# Patient Record
Sex: Male | Born: 1960 | Race: White | Hispanic: No | Marital: Single | Smoking: Never smoker
Health system: Southern US, Community
[De-identification: ages and names within clinical notes are randomized; demographics above are authoritative.]

## PROBLEM LIST (undated history)

## (undated) DIAGNOSIS — S66819A Strain of other specified muscles, fascia and tendons at wrist and hand level, unspecified hand, initial encounter: Secondary | ICD-10-CM

## (undated) DIAGNOSIS — I2510 Atherosclerotic heart disease of native coronary artery without angina pectoris: Secondary | ICD-10-CM

## (undated) DIAGNOSIS — F988 Other specified behavioral and emotional disorders with onset usually occurring in childhood and adolescence: Secondary | ICD-10-CM

## (undated) DIAGNOSIS — F419 Anxiety disorder, unspecified: Secondary | ICD-10-CM

## (undated) DIAGNOSIS — E7800 Pure hypercholesterolemia, unspecified: Secondary | ICD-10-CM

## (undated) DIAGNOSIS — I10 Essential (primary) hypertension: Secondary | ICD-10-CM

## (undated) HISTORY — DX: Atherosclerotic heart disease of native coronary artery without angina pectoris: I25.10

## (undated) HISTORY — DX: Pure hypercholesterolemia, unspecified: E78.00

## (undated) HISTORY — PX: WRIST SURGERY: SHX841

## (undated) HISTORY — DX: Essential (primary) hypertension: I10

## (undated) HISTORY — DX: Anxiety disorder, unspecified: F41.9

---

## 1998-01-31 ENCOUNTER — Emergency Department (HOSPITAL_COMMUNITY): Admission: EM | Admit: 1998-01-31 | Discharge: 1998-01-31 | Payer: Self-pay | Admitting: Emergency Medicine

## 1998-02-01 ENCOUNTER — Emergency Department (HOSPITAL_COMMUNITY): Admission: EM | Admit: 1998-02-01 | Discharge: 1998-02-01 | Payer: Self-pay | Admitting: Emergency Medicine

## 1998-02-02 ENCOUNTER — Emergency Department (HOSPITAL_COMMUNITY): Admission: EM | Admit: 1998-02-02 | Discharge: 1998-02-02 | Payer: Self-pay | Admitting: Emergency Medicine

## 2017-02-25 DIAGNOSIS — R945 Abnormal results of liver function studies: Secondary | ICD-10-CM | POA: Diagnosis not present

## 2017-03-29 DIAGNOSIS — R945 Abnormal results of liver function studies: Secondary | ICD-10-CM | POA: Diagnosis not present

## 2017-05-03 DIAGNOSIS — F4010 Social phobia, unspecified: Secondary | ICD-10-CM | POA: Diagnosis not present

## 2017-05-17 DIAGNOSIS — F4010 Social phobia, unspecified: Secondary | ICD-10-CM | POA: Diagnosis not present

## 2017-05-19 ENCOUNTER — Emergency Department (HOSPITAL_COMMUNITY): Payer: BLUE CROSS/BLUE SHIELD

## 2017-05-19 ENCOUNTER — Emergency Department (HOSPITAL_COMMUNITY)
Admission: EM | Admit: 2017-05-19 | Discharge: 2017-05-19 | Disposition: A | Discharge status: Home / Self Care | Payer: BLUE CROSS/BLUE SHIELD | Attending: Emergency Medicine | Admitting: Emergency Medicine

## 2017-05-19 ENCOUNTER — Encounter (HOSPITAL_COMMUNITY): Payer: Self-pay

## 2017-05-19 ENCOUNTER — Other Ambulatory Visit: Payer: Self-pay

## 2017-05-19 DIAGNOSIS — Z23 Encounter for immunization: Secondary | ICD-10-CM | POA: Insufficient documentation

## 2017-05-19 DIAGNOSIS — S61216A Laceration without foreign body of right little finger without damage to nail, initial encounter: Secondary | ICD-10-CM | POA: Insufficient documentation

## 2017-05-19 DIAGNOSIS — Y9389 Activity, other specified: Secondary | ICD-10-CM | POA: Insufficient documentation

## 2017-05-19 DIAGNOSIS — Y998 Other external cause status: Secondary | ICD-10-CM | POA: Insufficient documentation

## 2017-05-19 DIAGNOSIS — W25XXXA Contact with sharp glass, initial encounter: Secondary | ICD-10-CM | POA: Diagnosis not present

## 2017-05-19 DIAGNOSIS — Y929 Unspecified place or not applicable: Secondary | ICD-10-CM | POA: Diagnosis not present

## 2017-05-19 HISTORY — DX: Other specified behavioral and emotional disorders with onset usually occurring in childhood and adolescence: F98.8

## 2017-05-19 MED ORDER — BUPIVACAINE HCL (PF) 0.5 % IJ SOLN
30.0000 mL | Freq: Once | INTRAMUSCULAR | Status: AC
  Administered 2017-05-19: 30 mL
  Filled 2017-05-19: qty 30

## 2017-05-19 MED ORDER — HYDROCODONE-ACETAMINOPHEN 5-325 MG PO TABS
1.0000 | ORAL_TABLET | Freq: Once | ORAL | Status: AC
  Administered 2017-05-19: 1 via ORAL
  Filled 2017-05-19: qty 1

## 2017-05-19 MED ORDER — HYDROCODONE-ACETAMINOPHEN 5-325 MG PO TABS: 1.0000 | ORAL_TABLET | Freq: Four times a day (QID) | ORAL | 0 refills | Status: DC | PRN

## 2017-05-19 MED ORDER — BUPIVACAINE HCL 0.5 % IJ SOLN
50.0000 mL | Freq: Once | INTRAMUSCULAR | Status: DC
  Filled 2017-05-19: qty 50

## 2017-05-19 MED ORDER — AMOXICILLIN-POT CLAVULANATE 875-125 MG PO TABS
1.0000 | ORAL_TABLET | Freq: Once | ORAL | Status: AC
  Administered 2017-05-19: 1 via ORAL
  Filled 2017-05-19: qty 1

## 2017-05-19 MED ORDER — AMOXICILLIN-POT CLAVULANATE 875-125 MG PO TABS: 1.0000 | ORAL_TABLET | Freq: Two times a day (BID) | ORAL | 0 refills | Status: AC

## 2017-05-19 MED ORDER — TETANUS-DIPHTH-ACELL PERTUSSIS 5-2.5-18.5 LF-MCG/0.5 IM SUSP
0.5000 mL | Freq: Once | INTRAMUSCULAR | Status: AC
  Administered 2017-05-19: 0.5 mL via INTRAMUSCULAR
  Filled 2017-05-19: qty 0.5

## 2017-05-19 MED ORDER — BACITRACIN ZINC 500 UNIT/GM EX OINT
TOPICAL_OINTMENT | Freq: Once | CUTANEOUS | Status: AC
  Administered 2017-05-19: 1 via TOPICAL

## 2017-05-19 NOTE — ED Notes (Signed)
MD at bedside. 

## 2017-05-19 NOTE — ED Provider Notes (Signed)
[facility] Provider Note   CSN: [id number] Arrival date & time: 05/19/17  1854     History   Chief Complaint Chief Complaint  Patient presents with  . Extremity Laceration    HPI Edward Chapman is a 56 y.o. male resents with right fifth digit laceration that occurred approximately 6:30 PM this evening.  Patient reports that he was putting trash away and cut his left pinky on a piece of broken glass that was still in the trash can.  Patient initially went to urgent care and was prompted to go to the emergency department for further evaluation.  Patient reports initially had some numbness to the distal aspect of the finger but states that has improved.  Patient reports that he does not know when his last tetanus was.  He is not on blood thinners.    The history is provided by the patient.    Past Medical History:  Diagnosis Date  . ADD (attention deficit disorder)     There are no active problems to display for this patient.   History reviewed. No pertinent surgical history.     Home Medications    Prior to Admission medications   Medication Sig Start Date End Date Taking? Authorizing Provider  amoxicillin-clavulanate (AUGMENTIN) 875-125 MG tablet Take 1 tablet every 12 (twelve) hours for 7 days by mouth. 05/19/17 05/26/17  Maxwell CaulLayden, Facundo Allemand A, PA-C  HYDROcodone-acetaminophen (NORCO/VICODIN) 5-325 MG tablet Take 1-2 tablets every 6 (six) hours as needed by mouth. 05/19/17   Maxwell CaulLayden, Laylynn Campanella A, PA-C    Family History History reviewed. No pertinent family history.  Social History Social History   Tobacco Use  . Smoking status: Never Smoker  Substance Use Topics  . Alcohol use: Yes    Frequency: Never    Comment: occ  . Drug use: No     Allergies   Patient has no known allergies.   Review of Systems Review of Systems  Skin: Positive for wound.  Neurological: Negative for weakness and numbness.     Physical  Exam Updated Vital Signs BP 134/85   Pulse 72   Temp 98.5 F (36.9 C) (Oral)   Resp 16   Ht 6\' 2"  (1.88 m)   Wt 104.3 kg (230 lb)   SpO2 99%   BMI 29.53 kg/m   Physical Exam  Constitutional: He appears well-developed and well-nourished.  HENT:  Head: Normocephalic and atraumatic.  Eyes: Conjunctivae and EOM are normal. Right eye exhibits no discharge. Left eye exhibits no discharge. No scleral icterus.  Cardiovascular:  Pulses:      Radial pulses are 2+ on the right side, and 2+ on the left side.  Pulmonary/Chest: Effort normal.  Musculoskeletal:  Patient is unable to flex the right fifth digit.  No Flexion of the DIP.  Extension of right fifth digit intact without difficulty.  Full range of motion of the remaining digits of right hand.  No tenderness palpation to right wrist.  Neurological: He is alert.  Sensation intact along major nerve distributions of BLE. Good sensation to the distal tip of the right 5th digit.   Skin: Skin is warm and dry. Capillary refill takes less than 2 seconds.  1.5 cm V-shaped laceration noted to the dorsal aspect of the left 5th digit. Good distal cap refill of right 5th digit.   Psychiatric: He has a normal mood and affect. His speech is normal and behavior is normal.  Nursing note and vitals reviewed.  ED Treatments / Results  Labs (all labs ordered are listed, but only abnormal results are displayed) Labs Reviewed - No data to display  EKG  EKG Interpretation None       Radiology Dg Finger Little Right  Result Date: 05/19/2017 CLINICAL DATA:  Acute right little finger pain following laceration today. EXAM: RIGHT LITTLE FINGER 2+V COMPARISON:  None. FINDINGS: Soft tissue injury anterior to the PIP joint noted. No fracture, subluxation or dislocation identified. No radiopaque foreign bodies identified. IMPRESSION: Soft tissue injury without acute bony abnormality or radiopaque foreign body. Electronically Signed   By: Harmon Pier M.D.   On: 05/19/2017 19:59    Procedures .Marland KitchenLaceration Repair Date/Time: 05/19/2017 9:22 PM Performed by: Maxwell Caul, PA-C Authorized by: Maxwell Caul, PA-C   Consent:    Consent obtained:  Verbal   Consent given by:  Patient   Risks discussed:  Infection, pain and retained foreign body Anesthesia (see MAR for exact dosages):    Anesthesia method:  Nerve block   Block needle gauge:  25 G   Block anesthetic:  Bupivacaine 0.5% w/o epi   Block injection procedure:  Anatomic landmarks identified, incremental injection, introduced needle and negative aspiration for blood   Block outcome:  Anesthesia achieved Laceration details:    Location:  Finger   Finger location:  R small finger   Length (cm):  1.5 Pre-procedure details:    Preparation:  Patient was prepped and draped in usual sterile fashion Exploration:    Hemostasis achieved with:  Direct pressure   Wound exploration: wound explored through full range of motion     Wound extent: no foreign bodies/material noted   Treatment:    Area cleansed with:  Betadine and saline   Amount of cleaning:  Extensive   Irrigation solution:  Sterile saline   Irrigation method:  Syringe   Visualized foreign bodies/material removed: no   Skin repair:    Repair method:  Sutures   Suture size:  4-0   Suture material:  Nylon   Suture technique:  Simple interrupted   Number of sutures:  5 Approximation:    Approximation:  Close   Vermilion border: well-aligned   Post-procedure details:    Dressing:  Antibiotic ointment, sterile dressing and splint for protection   Patient tolerance of procedure:  Tolerated well, no immediate complications    (including critical care time)  Medications Ordered in ED Medications  Tdap (BOOSTRIX) injection 0.5 mL (0.5 mLs Intramuscular Given 05/19/17 2008)  HYDROcodone-acetaminophen (NORCO/VICODIN) 5-325 MG per tablet 1 tablet (1 tablet Oral Given 05/19/17 2007)  bupivacaine (MARCAINE) 0.5  % injection 30 mL (30 mLs Infiltration Given by Other 05/19/17 2147)  HYDROcodone-acetaminophen (NORCO/VICODIN) 5-325 MG per tablet 1 tablet (1 tablet Oral Given 05/19/17 2144)  bacitracin ointment (1 application Topical Given 05/19/17 2147)  amoxicillin-clavulanate (AUGMENTIN) 875-125 MG per tablet 1 tablet (1 tablet Oral Given 05/19/17 2144)     Initial Impression / Assessment and Plan / ED Course  I have reviewed the triage vital signs and the nursing notes.  Pertinent labs & imaging results that were available during my care of the patient were reviewed by me and considered in my medical decision making (see chart for details).     56 year old male who presents with right fifth digit laceration that occurred approximately 6:30 PM this evening.  Patient does not know when his last tetanus was.  He is not on blood thinners. Patient is afebrile, non-toxic appearing, sitting comfortably  on examination table. Vital signs reviewed and stable. Patient is neurovascularly intact.  Patient is unable to flex the right fifth digit.  Extension is intact.  Concern for flexor tendon injury.  Plan for wound care in the department.  Will update tetanus in the department. Plan to obtain XR imaging for further evaluation. Plan to consult hand for further evaluation.  X-ray reviewed.  Negative for any acute fracture or dislocation.  No evidence of foreign body.  Discussed with Dr. Merlyn LotKuzma (hand).  Recommends repair in the emergency department.  Recommend thorough washout for evaluation of any foreign body.  Plan to start patient on antibiotics and have him follow-up with his office.  Instructed patient to call office tomorrow.  Laceration repaired as documented above.  Patient tolerated procedure well.  We will plan to give first dose made biotics and pain medication here in the emergency department.  Wound care instructions discussed with patient and wife.  Patient instructed to follow-up with hand tomorrow.  Patient had ample opportunity for questions and discussion. All patient's questions were answered with full understanding. Strict return precautions discussed. Patient expresses understanding and agreement to plan.     Final Clinical Impressions(s) / ED Diagnoses   Final diagnoses:  Laceration of right little finger without foreign body without damage to nail, initial encounter    ED Discharge Orders        Ordered    amoxicillin-clavulanate (AUGMENTIN) 875-125 MG tablet  Every 12 hours     05/19/17 2125    HYDROcodone-acetaminophen (NORCO/VICODIN) 5-325 MG tablet  Every 6 hours PRN     05/19/17 2126       Maxwell CaulLayden, Westlynn Fifer A, PA-C 05/20/17 0127    Loren RacerYelverton, David, MD 05/24/17 [phone]

## 2017-05-19 NOTE — ED Notes (Signed)
Pharmacy contacted about Marcaine

## 2017-05-19 NOTE — Discharge Instructions (Signed)
Keep the wound clean and dry for the first 24 hours. After that you may gently clean the wound with soap and water. Make sure to pat dry the wound before covering it with any dressing. You can use topical antibiotic ointment and bandage. Ice and elevate for pain relief.   You can take Tylenol or Ibuprofen as directed for pain. You can alternate Tylenol and Ibuprofen every 4 hours for additional pain relief.  You take the pain medication for severe or breakthrough pain.  Follow-up with a hand doctor.  Call his office tomorrow and arrange for an appointment.  Monitor closely for any signs of infection. Return to the Emergency Department for any worsening redness/swelling of the area that begins to spread, drainage from the site, worsening pain, fever or any other worsening or concerning symptoms.

## 2017-05-19 NOTE — ED Notes (Signed)
Patient is A&Ox4 at this time.  Patient in no signs of distress.  Please see providers note for complete history and physical exam.  

## 2017-05-19 NOTE — ED Triage Notes (Signed)
Pt was stuffing a trash bag down into his garbage can and was cut on his left pinky finger with broken glass. Pt has 1 inch laceration to same finger. Bleeding controlled. [facility]

## 2017-05-20 ENCOUNTER — Ambulatory Visit (HOSPITAL_BASED_OUTPATIENT_CLINIC_OR_DEPARTMENT_OTHER)
Admission: RE | Admit: 2017-05-20 | Discharge: 2017-05-20 | Disposition: A | Discharge status: Home / Self Care | Payer: BLUE CROSS/BLUE SHIELD | Source: Ambulatory Visit | Attending: Orthopedic Surgery | Admitting: Orthopedic Surgery

## 2017-05-20 ENCOUNTER — Ambulatory Visit (HOSPITAL_BASED_OUTPATIENT_CLINIC_OR_DEPARTMENT_OTHER): Payer: BLUE CROSS/BLUE SHIELD | Admitting: Anesthesiology

## 2017-05-20 ENCOUNTER — Other Ambulatory Visit: Payer: Self-pay | Admitting: Orthopedic Surgery

## 2017-05-20 ENCOUNTER — Encounter (HOSPITAL_BASED_OUTPATIENT_CLINIC_OR_DEPARTMENT_OTHER)
Admission: RE | Disposition: A | Discharge status: Home / Self Care | Payer: Self-pay | Source: Ambulatory Visit | Attending: Orthopedic Surgery

## 2017-05-20 ENCOUNTER — Encounter (HOSPITAL_BASED_OUTPATIENT_CLINIC_OR_DEPARTMENT_OTHER): Payer: Self-pay | Admitting: Anesthesiology

## 2017-05-20 DIAGNOSIS — Z79899 Other long term (current) drug therapy: Secondary | ICD-10-CM | POA: Insufficient documentation

## 2017-05-20 DIAGNOSIS — S64496A Injury of digital nerve of right little finger, initial encounter: Secondary | ICD-10-CM | POA: Diagnosis not present

## 2017-05-20 DIAGNOSIS — S61216A Laceration without foreign body of right little finger without damage to nail, initial encounter: Secondary | ICD-10-CM | POA: Diagnosis not present

## 2017-05-20 DIAGNOSIS — W25XXXA Contact with sharp glass, initial encounter: Secondary | ICD-10-CM | POA: Diagnosis not present

## 2017-05-20 DIAGNOSIS — S65516A Laceration of blood vessel of right little finger, initial encounter: Secondary | ICD-10-CM | POA: Diagnosis not present

## 2017-05-20 DIAGNOSIS — S65111A Laceration of radial artery at wrist and hand level of right arm, initial encounter: Secondary | ICD-10-CM | POA: Diagnosis not present

## 2017-05-20 DIAGNOSIS — S66126A Laceration of flexor muscle, fascia and tendon of right little finger at wrist and hand level, initial encounter: Secondary | ICD-10-CM | POA: Diagnosis not present

## 2017-05-20 HISTORY — PX: NERVE, TENDON AND ARTERY REPAIR: SHX5695

## 2017-05-20 SURGERY — NERVE, TENDON AND ARTERY REPAIR
Anesthesia: General | Site: Finger | Laterality: Right

## 2017-05-20 MED ORDER — METOPROLOL TARTRATE 5 MG/5ML IV SOLN
INTRAVENOUS | Status: AC
  Filled 2017-05-20: qty 5

## 2017-05-20 MED ORDER — FENTANYL CITRATE (PF) 100 MCG/2ML IJ SOLN
INTRAMUSCULAR | Status: DC | PRN
  Administered 2017-05-20: 100 ug via INTRAVENOUS
  Administered 2017-05-20 (×4): 25 ug via INTRAVENOUS

## 2017-05-20 MED ORDER — PROPOFOL 10 MG/ML IV BOLUS
INTRAVENOUS | Status: AC
  Filled 2017-05-20: qty 20

## 2017-05-20 MED ORDER — FENTANYL CITRATE (PF) 100 MCG/2ML IJ SOLN: 50.0000 ug | INTRAMUSCULAR | Status: DC | PRN

## 2017-05-20 MED ORDER — LACTATED RINGERS IV SOLN: 500.0000 mL | INTRAVENOUS | Status: DC

## 2017-05-20 MED ORDER — BUPIVACAINE HCL (PF) 0.25 % IJ SOLN
INTRAMUSCULAR | Status: DC | PRN
  Administered 2017-05-20: 8 mL

## 2017-05-20 MED ORDER — PROPOFOL 10 MG/ML IV BOLUS
INTRAVENOUS | Status: DC | PRN
  Administered 2017-05-20: 200 mg via INTRAVENOUS

## 2017-05-20 MED ORDER — ONDANSETRON HCL 4 MG/2ML IJ SOLN
INTRAMUSCULAR | Status: AC
  Filled 2017-05-20: qty 2

## 2017-05-20 MED ORDER — PROMETHAZINE HCL 25 MG/ML IJ SOLN: 6.2500 mg | INTRAMUSCULAR | Status: DC | PRN

## 2017-05-20 MED ORDER — LACTATED RINGERS IV SOLN
INTRAVENOUS | Status: DC
  Administered 2017-05-20 (×3): via INTRAVENOUS

## 2017-05-20 MED ORDER — HEPARIN SODIUM (PORCINE) 1000 UNIT/ML IJ SOLN
INTRAMUSCULAR | Status: AC
  Filled 2017-05-20: qty 1

## 2017-05-20 MED ORDER — MIDAZOLAM HCL 5 MG/5ML IJ SOLN
INTRAMUSCULAR | Status: DC | PRN
  Administered 2017-05-20: 2 mg via INTRAVENOUS

## 2017-05-20 MED ORDER — PHENYLEPHRINE 40 MCG/ML (10ML) SYRINGE FOR IV PUSH (FOR BLOOD PRESSURE SUPPORT)
PREFILLED_SYRINGE | INTRAVENOUS | Status: AC
  Filled 2017-05-20: qty 10

## 2017-05-20 MED ORDER — ROCURONIUM BROMIDE 10 MG/ML (PF) SYRINGE
PREFILLED_SYRINGE | INTRAVENOUS | Status: AC
  Filled 2017-05-20: qty 5

## 2017-05-20 MED ORDER — FENTANYL CITRATE (PF) 100 MCG/2ML IJ SOLN
INTRAMUSCULAR | Status: AC
  Filled 2017-05-20: qty 2

## 2017-05-20 MED ORDER — SCOPOLAMINE 1 MG/3DAYS TD PT72: 1.0000 | MEDICATED_PATCH | Freq: Once | TRANSDERMAL | Status: DC | PRN

## 2017-05-20 MED ORDER — 0.9 % SODIUM CHLORIDE (POUR BTL) OPTIME
TOPICAL | Status: DC | PRN
  Administered 2017-05-20: 100 mL

## 2017-05-20 MED ORDER — MIDAZOLAM HCL 2 MG/2ML IJ SOLN
INTRAMUSCULAR | Status: AC
  Filled 2017-05-20: qty 2

## 2017-05-20 MED ORDER — BUPIVACAINE HCL (PF) 0.25 % IJ SOLN
INTRAMUSCULAR | Status: AC
  Filled 2017-05-20: qty 30

## 2017-05-20 MED ORDER — MIDAZOLAM HCL 2 MG/2ML IJ SOLN: 1.0000 mg | INTRAMUSCULAR | Status: DC | PRN

## 2017-05-20 MED ORDER — CEFAZOLIN SODIUM-DEXTROSE 2-3 GM-%(50ML) IV SOLR
INTRAVENOUS | Status: DC | PRN
  Administered 2017-05-20: 2 g via INTRAVENOUS

## 2017-05-20 MED ORDER — FENTANYL CITRATE (PF) 100 MCG/2ML IJ SOLN
25.0000 ug | INTRAMUSCULAR | Status: DC | PRN
  Administered 2017-05-20 (×2): 50 ug via INTRAVENOUS

## 2017-05-20 MED ORDER — LIDOCAINE HCL (PF) 1 % IJ SOLN
INTRAMUSCULAR | Status: AC
  Filled 2017-05-20: qty 30

## 2017-05-20 MED ORDER — LIDOCAINE HCL (CARDIAC) 20 MG/ML IV SOLN
INTRAVENOUS | Status: DC | PRN
  Administered 2017-05-20 (×2): 30 mg via INTRAVENOUS

## 2017-05-20 MED ORDER — METOPROLOL TARTRATE 5 MG/5ML IV SOLN
INTRAVENOUS | Status: DC | PRN
  Administered 2017-05-20: 2.5 mg via INTRAVENOUS

## 2017-05-20 MED ORDER — EPHEDRINE 5 MG/ML INJ
INTRAVENOUS | Status: AC
  Filled 2017-05-20: qty 20

## 2017-05-20 MED ORDER — MEPERIDINE HCL 25 MG/ML IJ SOLN: 6.2500 mg | INTRAMUSCULAR | Status: DC | PRN

## 2017-05-20 MED ORDER — HYDROCODONE-ACETAMINOPHEN 7.5-325 MG PO TABS: 1.0000 | ORAL_TABLET | Freq: Once | ORAL | Status: DC | PRN

## 2017-05-20 MED ORDER — HYDROCODONE-ACETAMINOPHEN 5-325 MG PO TABS: ORAL_TABLET | ORAL | 0 refills | Status: DC

## 2017-05-20 MED ORDER — SODIUM CHLORIDE 0.9 % IJ SOLN
INTRAMUSCULAR | Status: AC
  Filled 2017-05-20: qty 10

## 2017-05-20 MED ORDER — CEFAZOLIN SODIUM 1 G IJ SOLR
INTRAMUSCULAR | Status: AC
  Filled 2017-05-20: qty 20

## 2017-05-20 MED ORDER — DEXAMETHASONE SODIUM PHOSPHATE 10 MG/ML IJ SOLN
INTRAMUSCULAR | Status: AC
  Filled 2017-05-20: qty 1

## 2017-05-20 MED ORDER — DEXAMETHASONE SODIUM PHOSPHATE 4 MG/ML IJ SOLN
INTRAMUSCULAR | Status: DC | PRN
  Administered 2017-05-20: 10 mg via INTRAVENOUS

## 2017-05-20 SURGICAL SUPPLY — 68 items
BAG DECANTER FOR FLEXI CONT (MISCELLANEOUS) IMPLANT
BANDAGE ACE 3X5.8 VEL STRL LF (GAUZE/BANDAGES/DRESSINGS) ×3 IMPLANT
BLADE MINI RND TIP GREEN BEAV (BLADE) IMPLANT
BLADE SURG 15 STRL LF DISP TIS (BLADE) ×2 IMPLANT
BLADE SURG 15 STRL SS (BLADE) ×6
BNDG CMPR 9X4 STRL LF SNTH (GAUZE/BANDAGES/DRESSINGS) ×1
BNDG ESMARK 4X9 LF (GAUZE/BANDAGES/DRESSINGS) ×2 IMPLANT
BNDG GAUZE ELAST 4 BULKY (GAUZE/BANDAGES/DRESSINGS) ×3 IMPLANT
BRUSH SCRUB EZ PLAIN DRY (MISCELLANEOUS) ×5 IMPLANT
CATH ROBINSON RED A/P 10FR (CATHETERS) IMPLANT
CHLORAPREP W/TINT 26ML (MISCELLANEOUS) ×1 IMPLANT
CORD BIPOLAR FORCEPS 12FT (ELECTRODE) ×3 IMPLANT
COVER BACK TABLE 60X90IN (DRAPES) ×3 IMPLANT
COVER MAYO STAND STRL (DRAPES) ×3 IMPLANT
CUFF TOURNIQUET SINGLE 18IN (TOURNIQUET CUFF) IMPLANT
CUFF TOURNIQUET SINGLE 24IN (TOURNIQUET CUFF) ×2 IMPLANT
DECANTER SPIKE VIAL GLASS SM (MISCELLANEOUS) ×3 IMPLANT
DRAPE EXTREMITY T 121X128X90 (DRAPE) ×3 IMPLANT
DRAPE SURG 17X23 STRL (DRAPES) ×3 IMPLANT
GAUZE SPONGE 4X4 12PLY STRL (GAUZE/BANDAGES/DRESSINGS) ×3 IMPLANT
GAUZE XEROFORM 1X8 LF (GAUZE/BANDAGES/DRESSINGS) ×3 IMPLANT
GLOVE BIO SURGEON STRL SZ 6.5 (GLOVE) ×2 IMPLANT
GLOVE BIO SURGEON STRL SZ7 (GLOVE) ×2 IMPLANT
GLOVE BIO SURGEON STRL SZ7.5 (GLOVE) ×3 IMPLANT
GLOVE BIO SURGEONS STRL SZ 6.5 (GLOVE) ×1
GLOVE BIOGEL PI IND STRL 8 (GLOVE) ×1 IMPLANT
GLOVE BIOGEL PI IND STRL 8.5 (GLOVE) ×3 IMPLANT
GLOVE BIOGEL PI INDICATOR 8 (GLOVE) ×2
GLOVE BIOGEL PI INDICATOR 8.5 (GLOVE) ×6
GLOVE SURG ORTHO 8.0 STRL STRW (GLOVE) ×2 IMPLANT
GOWN STRL REUS W/ TWL LRG LVL3 (GOWN DISPOSABLE) ×1 IMPLANT
GOWN STRL REUS W/ TWL XL LVL3 (GOWN DISPOSABLE) IMPLANT
GOWN STRL REUS W/TWL LRG LVL3 (GOWN DISPOSABLE) ×3
GOWN STRL REUS W/TWL XL LVL3 (GOWN DISPOSABLE) ×9 IMPLANT
LOOP VESSEL MAXI BLUE (MISCELLANEOUS) IMPLANT
NDL SAFETY ECLIPSE 18X1.5 (NEEDLE) IMPLANT
NEEDLE HYPO 18GX1.5 SHARP (NEEDLE)
NEEDLE HYPO 25X1 1.5 SAFETY (NEEDLE) ×3 IMPLANT
NS IRRIG 1000ML POUR BTL (IV SOLUTION) ×3 IMPLANT
PACK BASIN DAY SURGERY FS (CUSTOM PROCEDURE TRAY) ×3 IMPLANT
PAD CAST 3X4 CTTN HI CHSV (CAST SUPPLIES) ×1 IMPLANT
PAD CAST 4YDX4 CTTN HI CHSV (CAST SUPPLIES) IMPLANT
PADDING CAST ABS 4INX4YD NS (CAST SUPPLIES) ×2
PADDING CAST ABS COTTON 4X4 ST (CAST SUPPLIES) ×1 IMPLANT
PADDING CAST COTTON 3X4 STRL (CAST SUPPLIES)
PADDING CAST COTTON 4X4 STRL (CAST SUPPLIES) ×3
SLEEVE SCD COMPRESS KNEE MED (MISCELLANEOUS) ×2 IMPLANT
SLEEVE SURGEON STRL (DRAPES) ×2 IMPLANT
SPEAR EYE SURG WECK-CEL (MISCELLANEOUS) ×3 IMPLANT
SPLINT PLASTER CAST XFAST 3X15 (CAST SUPPLIES) IMPLANT
SPLINT PLASTER XTRA FASTSET 3X (CAST SUPPLIES) ×34
STOCKINETTE 4X48 STRL (DRAPES) ×3 IMPLANT
SUT CHROMIC 5 0 P 3 (SUTURE) ×2 IMPLANT
SUT ETHIBOND 3-0 V-5 (SUTURE) IMPLANT
SUT ETHILON 4 0 PS 2 18 (SUTURE) ×3 IMPLANT
SUT FIBERWIRE 4-0 18 TAPR NDL (SUTURE)
SUT MERSILENE 4 0 P 3 (SUTURE) ×2 IMPLANT
SUT NYLON 9 0 VRM6 (SUTURE) ×3 IMPLANT
SUT PROLENE 6 0 P 1 18 (SUTURE) ×2 IMPLANT
SUT SILK 4 0 PS 2 (SUTURE) ×2 IMPLANT
SUT SUPRAMID 4-0 (SUTURE) ×2 IMPLANT
SUT VICRYL 4-0 PS2 18IN ABS (SUTURE) IMPLANT
SUTURE FIBERWR 4-0 18 TAPR NDL (SUTURE) IMPLANT
SYR BULB 3OZ (MISCELLANEOUS) ×3 IMPLANT
SYR CONTROL 10ML LL (SYRINGE) ×2 IMPLANT
TOWEL OR 17X24 6PK STRL BLUE (TOWEL DISPOSABLE) ×6 IMPLANT
TRAY DSU PREP LF (CUSTOM PROCEDURE TRAY) ×2 IMPLANT
UNDERPAD 30X30 (UNDERPADS AND DIAPERS) ×3 IMPLANT

## 2017-05-20 NOTE — Brief Op Note (Signed)
05/20/2017  3:18 PM  PATIENT:  Queen BlossomJeffrey Holcomb  56 y.o. male  PRE-OPERATIVE DIAGNOSIS:  LACERATION  RIGHT SMALL FINGER  POST-OPERATIVE DIAGNOSIS:  LACERATION  RIGHT SMALL FINGER  PROCEDURE:  Procedure(s): NERVE, TENDON AND ARTERY REPAIR RIGHT SMALL FINGER (Right)  SURGEON:  Surgeon(s) and Role:    * Betha LoaKuzma, Zyere Jiminez, MD - Primary    * Cindee SaltKuzma, Gary, MD - Assisting  PHYSICIAN ASSISTANT:   ASSISTANTS: Cindee SaltGary Eluzer Howdeshell, MD   ANESTHESIA:   general  EBL:  6 mL   BLOOD ADMINISTERED:none  DRAINS: none   LOCAL MEDICATIONS USED:  MARCAINE     SPECIMEN:  No Specimen  DISPOSITION OF SPECIMEN:  N/A  COUNTS:  YES  TOURNIQUET:   Total Tourniquet Time Documented: Upper Arm (Right) - 83 minutes Total: Upper Arm (Right) - 83 minutes   DICTATION: .Other Dictation: Dictation Number [id number]  PLAN OF CARE: Discharge to home after PACU  PATIENT DISPOSITION:  PACU - hemodynamically stable.

## 2017-05-20 NOTE — H&P (Signed)
  Edward Chapman is an 56 y.o. male.   Chief Complaint: right small finger laceration HPI: 56 yo lhd male states he sustained laceration to right small finger last night on piece of glass in garbage.  [facility]  Unable to flex small finger.  Allergies: Not on File  Past Medical History:  Diagnosis Date  . ADD (attention deficit disorder)     History reviewed. No pertinent surgical history.  Family History: History reviewed. No pertinent family history.  Social History:   reports that  has never smoked. he has never used smokeless tobacco. He reports that he drinks alcohol. He reports that he does not use drugs.  Medications: Medications Prior to Admission  Medication Sig Dispense Refill  . amoxicillin-clavulanate (AUGMENTIN) 875-125 MG tablet Take 1 tablet every 12 (twelve) hours for 7 days by mouth. 14 tablet 0  . guanFACINE (INTUNIV) 2 MG TB24 ER tablet Take 2 mg daily by mouth.    Marland Kitchen. HYDROcodone-acetaminophen (NORCO/VICODIN) 5-325 MG tablet Take 1-2 tablets every 6 (six) hours as needed by mouth. 6 tablet 0  . Lisdexamfetamine Dimesylate (VYVANSE PO) Take 1 day or 1 dose by mouth.      No results found for this or any previous visit (from the past 48 hour(s)).  Dg Finger Little Right  Result Date: 05/19/2017 CLINICAL DATA:  Acute right little finger pain following laceration today. EXAM: RIGHT LITTLE FINGER 2+V COMPARISON:  None. FINDINGS: Soft tissue injury anterior to the PIP joint noted. No fracture, subluxation or dislocation identified. No radiopaque foreign bodies identified. IMPRESSION: Soft tissue injury without acute bony abnormality or radiopaque foreign body. Electronically Signed   By: Harmon PierJeffrey  Hu M.D.   On: 05/19/2017 19:59     A comprehensive review of systems was negative.  Blood pressure 134/89, pulse 80, temperature 98.1 F (36.7 C), temperature source Oral, height 6\' 2"  (1.88 m), weight 108.1 kg (238 lb 4 oz), SpO2 98  %.  General appearance: alert, cooperative and appears stated age Head: Normocephalic, without obvious abnormality, atraumatic Neck: supple, symmetrical, trachea midline Resp: clear to auscultation bilaterally Cardio: regular rate and rhythm GI: non-tender Extremities: Intact sensation and capillary refill all digits.  +epl/fpl/io.  Laceration to volar aspect right small at p1.  Unable to flex small finger.  Decreased sensation on radial side. Pulses: 2+ and symmetric Skin: Skin color, texture, turgor normal. No rashes or lesions Neurologic: Grossly normal Incision/Wound:as above  Assessment/Plan Right small finger laceration with tendon and possible nerve/artery lacerations.  Non operative and operative treatment options were discussed with the patient and patient wishes to proceed with operative treatment. Risks, benefits, and alternatives of surgery were discussed and the patient agrees with the plan of care.   [name] 05/20/2017, 1:09 PM

## 2017-05-20 NOTE — Op Note (Signed)
[id number] 

## 2017-05-20 NOTE — Op Note (Signed)
NAMJob Founds:  Griebel, Verdie              ACCOUNT NO.:  [id number]  MEDICAL RECORD NO.:  [id number]  LOCATION:                                 FACILITY:  PHYSICIAN:  Betha LoaKevin Tajha Sammarco, MD        DATE OF BIRTH:  [birth year]  DATE OF PROCEDURE:  05/20/2017 DATE OF DISCHARGE:                              OPERATIVE REPORT   PREOPERATIVE DIAGNOSIS:  Right small finger laceration with possible tendon, artery, and nerve laceration.  POSTOPERATIVE DIAGNOSIS:  Right small finger flexor digitorum profundus and flexor digitorum superficialis zone 2 lacerations and radial digital nerve and artery laceration.  PROCEDURE:   1. Right small finger repair of FDS zone 2 laceration 2. Right small finger repair of FDP zone 2 laceration 3. Right small finger repair of radial digital nerve under microscope 4. Right small finger repair of radial digital artery under microscope.  SURGEON:  Betha LoaKevin Margarit Minshall, MD.  ASSISTANT:  Cindee SaltGary Shandrika Ambers, MD.  ANESTHESIA:  General.  IV FLUIDS:  Per anesthesia flow sheet.  ESTIMATED BLOOD LOSS:  Minimal.  COMPLICATIONS:  None.  SPECIMENS:  None.  TOURNIQUET TIME:  83 minutes.  DISPOSITION:  Stable to PACU.  INDICATIONS:  Mr. Edward Chapman is a [age] year old male, who states that yesterday he was pushing garbage down into the garbage can and sustained a laceration to the right small finger.  He has been unable to flex the small finger.  He was seen in the emergency department where the wound was cleaned and sutured.  He wished to proceed with operative exploration and repair.  Risks, benefits, and alternatives of surgery were discussed including the risk of blood loss; infection; damage to nerves, vessels, tendons, ligaments, bone; failure of surgery; need for additional surgery; complications with wound healing; continued pain and stiffness.  He voiced understanding of these risks and elected to proceed.  OPERATIVE COURSE:  After being identified preoperatively by myself,  the patient and I agreed upon procedure and site of procedure.  Surgical site was marked.  Risks, benefits, and alternatives of surgery were reviewed and he wished to proceed.  Surgical consent had been signed. He was given IV Ancef as preoperative antibiotic prophylaxis.  He was transferred to the operating room and placed on the operating room table in supine position with the right upper extremity on arm board.  General anesthesia was induced by anesthesiologist.  Right upper extremity was prepped and draped in normal sterile orthopedic fashion.  A surgical pause was performed between surgeons, Anesthesia, and operating room staff; and all were in agreement as to the patient, procedure, and site of procedure.  Tourniquet at the proximal aspect of the extremity was inflated to 250 mmHg after exsanguination of the limb with an Esmarch bandage.  The wound was extended in a Brunner type fashion proximally. Complete laceration of the FDP and FDS tendons were found.  The laceration went down to the proximal phalanx itself.  There was complete laceration of the radial digital nerve and artery on the radial side. The ulnar digital nerve was identified and was intact.  The ulnar digital artery was deep to the zone of injury.  The FDP and FDS were retrieved from proximally.  The ulnar arm of the FDS was excised to allow better excursion of the tendons and the sheath.  The periosteum and soft tissues were repaired back over the exposed bone of the proximal phalanx with a 4-0 chromic suture.  The remaining radial arm of the FDS tendon was repaired with a 4-0 Mersilene in a figure-of-eight fashion.  The FDP tendon was repaired with a 4-0 looped Supramid suture in a modified Kessler technique and a running 6-0 Prolene epitendinous suture was placed as well.  This provided good reapposition of the tendon ends.  The microscope was brought in.  The radial digital artery was cleared of clot and the ends  freshened.  The artery was then repaired in an interrupted circumferential fashion using 9-0 nylon suture.  The radial digital nerve was then repaired with the 9-0 nylon suture again in an interrupted circumferential fashion.  Good apposition of soft tissues was achieved.  The wound was copiously irrigated with sterile saline.  It was then closed with 4-0 nylon in a horizontal mattress fashion.  Digital block was performed with 8 mL of 0.25% plain Marcaine to aid in postoperative analgesia.  The wound was dressed with sterile Xeroform, 4x4s, and wrapped with a Kerlix bandage.  Dorsal blocking splint with the wrist 30 to 40 degrees flexed, the MPs flexed, and IPs extended.  This was wrapped with Kerlix and Ace bandage. Tourniquet was deflated at 83 minutes.  Fingertips were pink with brisk capillary refill after deflation of tourniquet.  The operative drapes were broken down, and the patient was awakened from anesthesia safely. He was transferred back to the stretcher and taken to PACU in stable condition.  I will see him back in the office in 1 week for postoperative followup.  I will give him Norco 5/325 one to two p.o. q.6 hours p.r.n. pain, dispensed #30.     Betha LoaKevin Desyre Calma, MD     KK/MEDQ  D:  05/20/2017  T:  05/20/2017  Job:  [id number]

## 2017-05-20 NOTE — Discharge Instructions (Addendum)

## 2017-05-20 NOTE — Op Note (Signed)
I assisted Surgeon(s) and Role:    * Betha LoaKuzma, Kevin, MD - Primary    Cindee Salt* Lynnex Fulp, MD - Assisting on the Procedure(s): NERVE, TENDON AND ARTERY REPAIR RIGHT SMALL FINGER on 05/20/2017.  I provided assistance on this case as follows: exploration, identification of arterey, nerve and tendons, repair of the tendons, artery and nerve using the microscope, closure of the wounds and application of the dressings and splint.   Electronically signed by: Nicki ReaperKUZMA,Anitra Doxtater R, MD Date: 05/20/2017 Time: 3:18 PM

## 2017-05-20 NOTE — Anesthesia Procedure Notes (Signed)
Procedure Name: LMA Insertion Date/Time: 05/20/2017 1:32 PM Performed by: [place] DesanctisLinka, Pavlos Yon L, CRNA Pre-anesthesia Checklist: Patient identified, Emergency Drugs available, Suction available, Patient being monitored and Timeout performed Patient Re-evaluated:Patient Re-evaluated prior to induction Oxygen Delivery Method: Circle system utilized Preoxygenation: Pre-oxygenation with 100% oxygen Induction Type: IV induction Ventilation: Mask ventilation without difficulty LMA: LMA inserted LMA Size: 5.0 Number of attempts: 1 Airway Equipment and Method: Bite block Placement Confirmation: positive ETCO2 Tube secured with: Tape Dental Injury: Teeth and Oropharynx as per pre-operative assessment

## 2017-05-20 NOTE — Transfer of Care (Signed)
Immediate Anesthesia Transfer of Care Note  Patient: Edward BlossomJeffrey Chapman  Procedure(s) Performed: NERVE, TENDON AND ARTERY REPAIR RIGHT SMALL FINGER (Right Finger)  Patient Location: PACU  Anesthesia Type:General  Level of Consciousness: sedated  Airway & Oxygen Therapy: Patient Spontanous Breathing and Patient connected to face mask oxygen  Post-op Assessment: Report given to RN and Post -op Vital signs reviewed and stable  Post vital signs: Reviewed and stable  Last Vitals:  Vitals:   05/20/17 1231  BP: 134/89  Pulse: 80  Temp: 36.7 C  SpO2: 98%    Last Pain:  Vitals:   05/20/17 1231  TempSrc: Oral  PainSc: 5          Complications: No apparent anesthesia complications

## 2017-05-20 NOTE — Anesthesia Postprocedure Evaluation (Signed)
Anesthesia Post Note  Patient: Edward Chapman  Procedure(s) Performed: NERVE, TENDON AND ARTERY REPAIR RIGHT SMALL FINGER (Right Finger)     Patient location during evaluation: PACU Anesthesia Type: General Level of consciousness: awake and alert and oriented Pain management: pain level controlled Vital Signs Assessment: post-procedure vital signs reviewed and stable Respiratory status: spontaneous breathing, nonlabored ventilation and respiratory function stable Cardiovascular status: blood pressure returned to baseline and stable Postop Assessment: no apparent nausea or vomiting Anesthetic complications: no    Last Vitals:  Vitals:   05/20/17 1613 05/20/17 1632  BP:  (!) 148/93  Pulse: 79 80  Resp: 17 18  Temp:  36.8 C  SpO2: 94% 97%    Last Pain:  Vitals:   05/20/17 1632  TempSrc: Oral  PainSc: 2                  Arnice Vanepps A.

## 2017-05-20 NOTE — Anesthesia Preprocedure Evaluation (Signed)
Anesthesia Evaluation  Patient identified by MRN, date of birth, ID band Patient awake    Reviewed: Allergy & Precautions, NPO status , Patient's Chart, lab work & pertinent test results  Airway Mallampati: II  TM Distance: >3 FB Neck ROM: Full    Dental no notable dental hx. (+) Teeth Intact   Pulmonary neg pulmonary ROS,    Pulmonary exam normal breath sounds clear to auscultation       Cardiovascular negative cardio ROS Normal cardiovascular exam Rhythm:Regular Rate:Normal     Neuro/Psych PSYCHIATRIC DISORDERS ADDnegative neurological ROS     GI/Hepatic negative GI ROS, Neg liver ROS,   Endo/Other  negative endocrine ROS  Renal/GU negative Renal ROS  negative genitourinary   Musculoskeletal Laceration right small finger   Abdominal   Peds  Hematology negative hematology ROS (+)   Anesthesia Other Findings   Reproductive/Obstetrics                             Anesthesia Physical Anesthesia Plan  ASA: II  Anesthesia Plan: General   Post-op Pain Management:    Induction: Intravenous  PONV Risk Score and Plan:   Airway Management Planned: LMA  Additional Equipment:   Intra-op Plan:   Post-operative Plan:   Informed Consent: I have reviewed the patients History and Physical, chart, labs and discussed the procedure including the risks, benefits and alternatives for the proposed anesthesia with the patient or authorized representative who has indicated his/her understanding and acceptance.   Dental advisory given  Plan Discussed with: CRNA, Anesthesiologist and Surgeon  Anesthesia Plan Comments:         Anesthesia Quick Evaluation

## 2017-05-23 ENCOUNTER — Encounter (HOSPITAL_BASED_OUTPATIENT_CLINIC_OR_DEPARTMENT_OTHER): Payer: Self-pay | Admitting: Orthopedic Surgery

## 2017-05-25 DIAGNOSIS — S66126D Laceration of flexor muscle, fascia and tendon of right little finger at wrist and hand level, subsequent encounter: Secondary | ICD-10-CM | POA: Diagnosis not present

## 2017-05-25 DIAGNOSIS — S64496D Injury of digital nerve of right little finger, subsequent encounter: Secondary | ICD-10-CM | POA: Diagnosis not present

## 2017-05-25 DIAGNOSIS — S65516D Laceration of blood vessel of right little finger, subsequent encounter: Secondary | ICD-10-CM | POA: Diagnosis not present

## 2017-05-25 DIAGNOSIS — M25641 Stiffness of right hand, not elsewhere classified: Secondary | ICD-10-CM | POA: Diagnosis not present

## 2017-05-25 DIAGNOSIS — M79644 Pain in right finger(s): Secondary | ICD-10-CM | POA: Diagnosis not present

## 2017-05-31 DIAGNOSIS — F4010 Social phobia, unspecified: Secondary | ICD-10-CM | POA: Diagnosis not present

## 2017-06-01 ENCOUNTER — Other Ambulatory Visit: Payer: Self-pay | Admitting: Orthopedic Surgery

## 2017-06-01 DIAGNOSIS — S66126D Laceration of flexor muscle, fascia and tendon of right little finger at wrist and hand level, subsequent encounter: Secondary | ICD-10-CM

## 2017-06-03 ENCOUNTER — Ambulatory Visit
Admission: RE | Admit: 2017-06-03 | Discharge: 2017-06-03 | Disposition: A | Discharge status: Home / Self Care | Payer: BLUE CROSS/BLUE SHIELD | Source: Ambulatory Visit | Attending: Orthopedic Surgery | Admitting: Orthopedic Surgery

## 2017-06-03 DIAGNOSIS — S66126D Laceration of flexor muscle, fascia and tendon of right little finger at wrist and hand level, subsequent encounter: Secondary | ICD-10-CM

## 2017-06-03 DIAGNOSIS — S56221A Laceration of other flexor muscle, fascia and tendon at forearm level, right arm, initial encounter: Secondary | ICD-10-CM | POA: Diagnosis not present

## 2017-06-06 ENCOUNTER — Encounter (HOSPITAL_BASED_OUTPATIENT_CLINIC_OR_DEPARTMENT_OTHER): Payer: Self-pay | Admitting: *Deleted

## 2017-06-06 ENCOUNTER — Other Ambulatory Visit: Payer: Self-pay | Admitting: Orthopedic Surgery

## 2017-06-06 ENCOUNTER — Other Ambulatory Visit: Payer: Self-pay

## 2017-06-09 ENCOUNTER — Ambulatory Visit (HOSPITAL_BASED_OUTPATIENT_CLINIC_OR_DEPARTMENT_OTHER): Payer: BLUE CROSS/BLUE SHIELD | Admitting: Anesthesiology

## 2017-06-09 ENCOUNTER — Encounter (HOSPITAL_BASED_OUTPATIENT_CLINIC_OR_DEPARTMENT_OTHER): Payer: Self-pay | Admitting: Anesthesiology

## 2017-06-09 ENCOUNTER — Encounter (HOSPITAL_BASED_OUTPATIENT_CLINIC_OR_DEPARTMENT_OTHER)
Admission: RE | Disposition: A | Discharge status: Home / Self Care | Payer: Self-pay | Source: Ambulatory Visit | Attending: Orthopedic Surgery

## 2017-06-09 ENCOUNTER — Ambulatory Visit (HOSPITAL_BASED_OUTPATIENT_CLINIC_OR_DEPARTMENT_OTHER)
Admission: RE | Admit: 2017-06-09 | Discharge: 2017-06-09 | Disposition: A | Discharge status: Home / Self Care | Payer: BLUE CROSS/BLUE SHIELD | Source: Ambulatory Visit | Attending: Orthopedic Surgery | Admitting: Orthopedic Surgery

## 2017-06-09 ENCOUNTER — Other Ambulatory Visit: Payer: Self-pay

## 2017-06-09 DIAGNOSIS — S66116A Strain of flexor muscle, fascia and tendon of right little finger at wrist and hand level, initial encounter: Secondary | ICD-10-CM | POA: Insufficient documentation

## 2017-06-09 DIAGNOSIS — X58XXXA Exposure to other specified factors, initial encounter: Secondary | ICD-10-CM | POA: Diagnosis not present

## 2017-06-09 DIAGNOSIS — G8918 Other acute postprocedural pain: Secondary | ICD-10-CM | POA: Diagnosis not present

## 2017-06-09 DIAGNOSIS — Z79899 Other long term (current) drug therapy: Secondary | ICD-10-CM | POA: Diagnosis not present

## 2017-06-09 DIAGNOSIS — T85622A Displacement of permanent sutures, initial encounter: Secondary | ICD-10-CM | POA: Diagnosis not present

## 2017-06-09 DIAGNOSIS — M66341 Spontaneous rupture of flexor tendons, right hand: Secondary | ICD-10-CM | POA: Diagnosis not present

## 2017-06-09 HISTORY — PX: FLEXOR TENDON REPAIR: SHX6501

## 2017-06-09 SURGERY — REPAIR, TENDON, FLEXOR
Anesthesia: General | Site: Hand | Laterality: Right

## 2017-06-09 MED ORDER — FENTANYL CITRATE (PF) 100 MCG/2ML IJ SOLN
50.0000 ug | INTRAMUSCULAR | Status: AC | PRN
  Administered 2017-06-09 (×2): 25 ug via INTRAVENOUS
  Administered 2017-06-09 (×2): 50 ug via INTRAVENOUS

## 2017-06-09 MED ORDER — VASOPRESSIN 20 UNIT/ML IV SOLN
INTRAVENOUS | Status: AC
  Filled 2017-06-09: qty 1

## 2017-06-09 MED ORDER — PROPOFOL 10 MG/ML IV BOLUS
INTRAVENOUS | Status: AC
  Filled 2017-06-09: qty 20

## 2017-06-09 MED ORDER — LIDOCAINE 2% (20 MG/ML) 5 ML SYRINGE
INTRAMUSCULAR | Status: AC
  Filled 2017-06-09: qty 5

## 2017-06-09 MED ORDER — CEFAZOLIN SODIUM-DEXTROSE 2-4 GM/100ML-% IV SOLN
INTRAVENOUS | Status: AC
  Filled 2017-06-09: qty 100

## 2017-06-09 MED ORDER — SCOPOLAMINE 1 MG/3DAYS TD PT72: 1.0000 | MEDICATED_PATCH | Freq: Once | TRANSDERMAL | Status: DC | PRN

## 2017-06-09 MED ORDER — LACTATED RINGERS IV SOLN
INTRAVENOUS | Status: DC
  Administered 2017-06-09 (×2): via INTRAVENOUS

## 2017-06-09 MED ORDER — LIDOCAINE 2% (20 MG/ML) 5 ML SYRINGE
INTRAMUSCULAR | Status: DC | PRN
  Administered 2017-06-09: 80 mg via INTRAVENOUS

## 2017-06-09 MED ORDER — BUPIVACAINE-EPINEPHRINE (PF) 0.5% -1:200000 IJ SOLN
INTRAMUSCULAR | Status: DC | PRN
  Administered 2017-06-09: 30 mL via PERINEURAL

## 2017-06-09 MED ORDER — ONDANSETRON HCL 4 MG/2ML IJ SOLN
INTRAMUSCULAR | Status: DC | PRN
  Administered 2017-06-09: 4 mg via INTRAVENOUS

## 2017-06-09 MED ORDER — OXYCODONE HCL 5 MG PO TABS
5.0000 mg | ORAL_TABLET | Freq: Once | ORAL | Status: DC | PRN
Start: 2017-06-09 — End: 2017-06-09

## 2017-06-09 MED ORDER — CHLORHEXIDINE GLUCONATE 4 % EX LIQD: 60.0000 mL | Freq: Once | CUTANEOUS | Status: DC

## 2017-06-09 MED ORDER — ONDANSETRON HCL 4 MG/2ML IJ SOLN: 4.0000 mg | Freq: Four times a day (QID) | INTRAMUSCULAR | Status: DC | PRN

## 2017-06-09 MED ORDER — MIDAZOLAM HCL 2 MG/2ML IJ SOLN
INTRAMUSCULAR | Status: AC
  Filled 2017-06-09: qty 2

## 2017-06-09 MED ORDER — OXYCODONE HCL 5 MG/5ML PO SOLN: 5.0000 mg | Freq: Once | ORAL | Status: DC | PRN

## 2017-06-09 MED ORDER — DEXAMETHASONE SODIUM PHOSPHATE 10 MG/ML IJ SOLN
INTRAMUSCULAR | Status: AC
  Filled 2017-06-09: qty 1

## 2017-06-09 MED ORDER — DEXAMETHASONE SODIUM PHOSPHATE 4 MG/ML IJ SOLN
INTRAMUSCULAR | Status: DC | PRN
  Administered 2017-06-09: 10 mg via INTRAVENOUS

## 2017-06-09 MED ORDER — CEFAZOLIN SODIUM-DEXTROSE 2-4 GM/100ML-% IV SOLN
2.0000 g | INTRAVENOUS | Status: AC
  Administered 2017-06-09: 2 g via INTRAVENOUS

## 2017-06-09 MED ORDER — FENTANYL CITRATE (PF) 100 MCG/2ML IJ SOLN
INTRAMUSCULAR | Status: AC
  Filled 2017-06-09: qty 2

## 2017-06-09 MED ORDER — ONDANSETRON HCL 4 MG/2ML IJ SOLN
INTRAMUSCULAR | Status: AC
  Filled 2017-06-09: qty 2

## 2017-06-09 MED ORDER — OXYCODONE-ACETAMINOPHEN 5-325 MG PO TABS: ORAL_TABLET | ORAL | 0 refills | Status: DC

## 2017-06-09 MED ORDER — MIDAZOLAM HCL 2 MG/2ML IJ SOLN
1.0000 mg | INTRAMUSCULAR | Status: DC | PRN
  Administered 2017-06-09: 1 mg via INTRAVENOUS
  Administered 2017-06-09: 2 mg via INTRAVENOUS

## 2017-06-09 MED ORDER — FENTANYL CITRATE (PF) 100 MCG/2ML IJ SOLN: 25.0000 ug | INTRAMUSCULAR | Status: DC | PRN

## 2017-06-09 SURGICAL SUPPLY — 79 items
BAG DECANTER FOR FLEXI CONT (MISCELLANEOUS) IMPLANT
BALL CTTN LRG ABS STRL LF (GAUZE/BANDAGES/DRESSINGS)
BANDAGE ACE 3X5.8 VEL STRL LF (GAUZE/BANDAGES/DRESSINGS) ×3 IMPLANT
BLADE MINI RND TIP GREEN BEAV (BLADE) IMPLANT
BLADE SURG 15 STRL LF DISP TIS (BLADE) ×2 IMPLANT
BLADE SURG 15 STRL SS (BLADE) ×6
BNDG CMPR 9X4 STRL LF SNTH (GAUZE/BANDAGES/DRESSINGS) ×1
BNDG CONFORM 2 STRL LF (GAUZE/BANDAGES/DRESSINGS) IMPLANT
BNDG ELASTIC 2X5.8 VLCR STR LF (GAUZE/BANDAGES/DRESSINGS) IMPLANT
BNDG ESMARK 4X9 LF (GAUZE/BANDAGES/DRESSINGS) ×3 IMPLANT
BNDG GAUZE ELAST 4 BULKY (GAUZE/BANDAGES/DRESSINGS) ×3 IMPLANT
CATH ROBINSON RED A/P 10FR (CATHETERS) IMPLANT
CHLORAPREP W/TINT 26ML (MISCELLANEOUS) ×3 IMPLANT
CORD BIPOLAR FORCEPS 12FT (ELECTRODE) ×3 IMPLANT
COTTONBALL LRG STERILE PKG (GAUZE/BANDAGES/DRESSINGS) IMPLANT
COVER BACK TABLE 60X90IN (DRAPES) ×3 IMPLANT
COVER MAYO STAND STRL (DRAPES) ×3 IMPLANT
CUFF TOURNIQUET SINGLE 18IN (TOURNIQUET CUFF) ×3 IMPLANT
DECANTER SPIKE VIAL GLASS SM (MISCELLANEOUS) IMPLANT
DRAIN TLS ROUND 10FR (DRAIN) IMPLANT
DRAPE EXTREMITY T 121X128X90 (DRAPE) ×3 IMPLANT
DRAPE OEC MINIVIEW 54X84 (DRAPES) IMPLANT
DRAPE SURG 17X23 STRL (DRAPES) ×3 IMPLANT
DRSG PAD ABDOMINAL 8X10 ST (GAUZE/BANDAGES/DRESSINGS) IMPLANT
GAUZE SPONGE 4X4 12PLY STRL (GAUZE/BANDAGES/DRESSINGS) ×3 IMPLANT
GAUZE SPONGE 4X4 16PLY XRAY LF (GAUZE/BANDAGES/DRESSINGS) IMPLANT
GAUZE XEROFORM 1X8 LF (GAUZE/BANDAGES/DRESSINGS) ×3 IMPLANT
GLOVE BIO SURGEON STRL SZ7.5 (GLOVE) ×3 IMPLANT
GLOVE BIOGEL PI IND STRL 8 (GLOVE) ×1 IMPLANT
GLOVE BIOGEL PI IND STRL 8.5 (GLOVE) IMPLANT
GLOVE BIOGEL PI INDICATOR 8 (GLOVE) ×2
GLOVE BIOGEL PI INDICATOR 8.5 (GLOVE)
GLOVE SURG ORTHO 8.0 STRL STRW (GLOVE) IMPLANT
GOWN STRL REUS W/ TWL LRG LVL3 (GOWN DISPOSABLE) ×1 IMPLANT
GOWN STRL REUS W/TWL LRG LVL3 (GOWN DISPOSABLE) ×3
GOWN STRL REUS W/TWL XL LVL3 (GOWN DISPOSABLE) ×3 IMPLANT
K-WIRE .035X4 (WIRE) IMPLANT
LOOP VESSEL MAXI BLUE (MISCELLANEOUS) IMPLANT
NDL KEITH (NEEDLE) IMPLANT
NEEDLE HYPO 25X1 1.5 SAFETY (NEEDLE) IMPLANT
NEEDLE KEITH (NEEDLE) IMPLANT
NS IRRIG 1000ML POUR BTL (IV SOLUTION) ×3 IMPLANT
PACK BASIN DAY SURGERY FS (CUSTOM PROCEDURE TRAY) ×3 IMPLANT
PAD CAST 3X4 CTTN HI CHSV (CAST SUPPLIES) ×1 IMPLANT
PAD CAST 4YDX4 CTTN HI CHSV (CAST SUPPLIES) IMPLANT
PADDING CAST ABS 3INX4YD NS (CAST SUPPLIES)
PADDING CAST ABS 4INX4YD NS (CAST SUPPLIES) ×2
PADDING CAST ABS COTTON 3X4 (CAST SUPPLIES) IMPLANT
PADDING CAST ABS COTTON 4X4 ST (CAST SUPPLIES) ×1 IMPLANT
PADDING CAST COTTON 3X4 STRL (CAST SUPPLIES) ×3
PADDING CAST COTTON 4X4 STRL (CAST SUPPLIES)
SLEEVE SCD COMPRESS KNEE MED (MISCELLANEOUS) IMPLANT
SPLINT PLASTER CAST XFAST 3X15 (CAST SUPPLIES) IMPLANT
SPLINT PLASTER CAST XFAST 4X15 (CAST SUPPLIES) IMPLANT
SPLINT PLASTER XTRA FAST SET 4 (CAST SUPPLIES)
SPLINT PLASTER XTRA FASTSET 3X (CAST SUPPLIES)
STOCKINETTE 4X48 STRL (DRAPES) ×3 IMPLANT
SUT CHROMIC 5 0 P 3 (SUTURE) IMPLANT
SUT ETHIBOND 3-0 V-5 (SUTURE) IMPLANT
SUT ETHILON 3 0 PS 1 (SUTURE) IMPLANT
SUT ETHILON 4 0 PS 2 18 (SUTURE) ×6 IMPLANT
SUT FIBERWIRE 4-0 18 DIAM BLUE (SUTURE) ×3
SUT MERSILENE 2.0 SH NDLE (SUTURE) IMPLANT
SUT MERSILENE 4 0 P 3 (SUTURE) IMPLANT
SUT POLY BUTTON 15MM (SUTURE) ×2 IMPLANT
SUT PROLENE 2 0 SH DA (SUTURE) IMPLANT
SUT PROLENE 5 0 P 3 (SUTURE) IMPLANT
SUT SILK 4 0 PS 2 (SUTURE) ×3 IMPLANT
SUT SUPRAMID 4-0 (SUTURE) IMPLANT
SUT VIC AB 4-0 P-3 18XBRD (SUTURE) IMPLANT
SUT VIC AB 4-0 P3 18 (SUTURE)
SUT VICRYL 4-0 PS2 18IN ABS (SUTURE) IMPLANT
SUTURE FIBERWR 4-0 18 DIA BLUE (SUTURE) IMPLANT
SYR BULB 3OZ (MISCELLANEOUS) ×3 IMPLANT
SYR CONTROL 10ML LL (SYRINGE) IMPLANT
TOWEL OR 17X24 6PK STRL BLUE (TOWEL DISPOSABLE) ×6 IMPLANT
TUBE FEEDING 8FR 16IN STR KANG (MISCELLANEOUS) ×2 IMPLANT
TUBE FEEDING ENTERAL 5FR 16IN (TUBING) IMPLANT
UNDERPAD 30X30 (UNDERPADS AND DIAPERS) ×3 IMPLANT

## 2017-06-09 NOTE — Brief Op Note (Signed)
06/09/2017  3:21 PM  PATIENT:  Edward Chapman  56 y.o. male  PRE-OPERATIVE DIAGNOSIS:  RIGHT SMALL FEXOR TENDON RUPTURE  POST-OPERATIVE DIAGNOSIS:  RIGHT SMALL FLEXOR TENDON RUPTURE  PROCEDURE:  Procedure(s): RIGHT FLEXOR TENDON REPAIR RECONSTRUCTION (Right)  SURGEON:  Surgeon(s) and Role:    * Betha LoaKuzma, Satia Winger, MD - Primary    * Cindee SaltKuzma, Gary, MD - Assisting  PHYSICIAN ASSISTANT:   ASSISTANTS: Cindee SaltGary Daphyne Miguez, MD   ANESTHESIA:   regional and general  EBL:  Minimal   BLOOD ADMINISTERED:none  DRAINS: none   LOCAL MEDICATIONS USED:  NONE  SPECIMEN:  No Specimen  DISPOSITION OF SPECIMEN:  N/A  COUNTS:  YES  TOURNIQUET:   Total Tourniquet Time Documented: Upper Arm (Right) - 86 minutes Total: Upper Arm (Right) - 86 minutes   DICTATION: .Other Dictation: Dictation Number [id number]  PLAN OF CARE: Discharge to home after PACU  PATIENT DISPOSITION:  PACU - hemodynamically stable.

## 2017-06-09 NOTE — Discharge Instructions (Addendum)

## 2017-06-09 NOTE — Anesthesia Preprocedure Evaluation (Signed)
Anesthesia Evaluation  Patient identified by MRN, date of birth, ID band Patient awake    Reviewed: Allergy & Precautions, NPO status , Patient's Chart, lab work & pertinent test results  Airway Mallampati: II  TM Distance: >3 FB Neck ROM: Full    Dental  (+) Dental Advisory Given   Pulmonary neg pulmonary ROS,    breath sounds clear to auscultation       Cardiovascular negative cardio ROS   Rhythm:Regular Rate:Normal     Neuro/Psych negative neurological ROS     GI/Hepatic negative GI ROS, Neg liver ROS,   Endo/Other  negative endocrine ROS  Renal/GU negative Renal ROS     Musculoskeletal   Abdominal   Peds  Hematology negative hematology ROS (+)   Anesthesia Other Findings   Reproductive/Obstetrics                             Anesthesia Physical Anesthesia Plan  ASA: I  Anesthesia Plan: General   Post-op Pain Management:    Induction: Intravenous  PONV Risk Score and Plan:   Airway Management Planned: LMA  Additional Equipment:   Intra-op Plan:   Post-operative Plan: Extubation in OR  Informed Consent: I have reviewed the patients History and Physical, chart, labs and discussed the procedure including the risks, benefits and alternatives for the proposed anesthesia with the patient or authorized representative who has indicated his/her understanding and acceptance.   Dental advisory given  Plan Discussed with: CRNA  Anesthesia Plan Comments:         Anesthesia Quick Evaluation

## 2017-06-09 NOTE — Anesthesia Procedure Notes (Signed)
Procedure Name: LMA Insertion Date/Time: 06/09/2017 1:37 PM Performed by: Gar GibbonKeeton, Sulaiman Imbert S, CRNA Pre-anesthesia Checklist: Patient identified, Emergency Drugs available, Suction available and Patient being monitored Patient Re-evaluated:Patient Re-evaluated prior to induction Oxygen Delivery Method: Circle system utilized Preoxygenation: Pre-oxygenation with 100% oxygen Induction Type: IV induction Ventilation: Mask ventilation without difficulty LMA: LMA inserted LMA Size: 5.0 Number of attempts: 1 Airway Equipment and Method: Bite block Placement Confirmation: positive ETCO2 Tube secured with: Tape Dental Injury: Teeth and Oropharynx as per pre-operative assessment

## 2017-06-09 NOTE — H&P (Signed)
  Edward Chapman is an 56 y.o. male.   Chief Complaint: right small finger flexor repair failure HPI: 56 yo male s/p right small finger flexor tendon repair with subsequent failure of repair.  He wishes to undergo flexor tendon reconstruction.    Allergies: No Known Allergies  Past Medical History:  Diagnosis Date  . ADD (attention deficit disorder)     Past Surgical History:  Procedure Laterality Date  . NERVE, TENDON AND ARTERY REPAIR Right 05/20/2017   Procedure: NERVE, TENDON AND ARTERY REPAIR RIGHT SMALL FINGER;  Surgeon: Betha LoaKuzma, Neoma Uhrich, MD;  Location: [facility];  Service: Orthopedics;  Laterality: Right;  . WRIST SURGERY      Family History: History reviewed. No pertinent family history.  Social History:   reports that  has never smoked. he has never used smokeless tobacco. He reports that he drinks alcohol. He reports that he does not use drugs.  Medications: Medications Prior to Admission  Medication Sig Dispense Refill  . guanFACINE (INTUNIV) 2 MG TB24 ER tablet Take 3 mg by mouth daily.     . Lisdexamfetamine Dimesylate (VYVANSE PO) Take 50 mg by mouth 1 day or 1 dose.     . pravastatin (PRAVACHOL) 40 MG tablet Take 40 mg by mouth daily.    Marland Kitchen. HYDROcodone-acetaminophen (NORCO/VICODIN) 5-325 MG tablet Take 1-2 tablets every 6 (six) hours as needed by mouth. 6 tablet 0    No results found for this or any previous visit (from the past 48 hour(s)).  No results found.   A comprehensive review of systems was negative.  Blood pressure 132/85, pulse 86, temperature 98 F (36.7 C), temperature source Oral, resp. rate 16, height 6\' 2"  (1.88 m), weight 105 kg (231 lb 6.4 oz), SpO2 98 %.  General appearance: alert, cooperative and appears stated age Head: Normocephalic, without obvious abnormality, atraumatic Neck: supple, symmetrical, trachea midline Resp: clear to auscultation bilaterally Cardio: regular rate and rhythm GI: non-tender Extremities:  Intact sensation and capillary refill all digits.  +epl/fpl/io.  Healed surgical incision right small finger.  Unable to flex small finger. Pulses: 2+ and symmetric Skin: Skin color, texture, turgor normal. No rashes or lesions Neurologic: Grossly normal Incision/Wound:as above  Assessment/Plan Right small finger flexor tendon repair with failure.  Plan reconstruction either single or two stage as necessary.  Risks, benefits, and alternatives of surgery were discussed and the patient agrees with the plan of care.   Shakea Isip R 06/09/2017, 1:17 PM

## 2017-06-09 NOTE — Op Note (Signed)
NAMJob Founds:  Edward Chapman, Edward Chapman              ACCOUNT NO.:  [id number]  MEDICAL RECORD NO.:  [id number]  LOCATION:                                 FACILITY:  PHYSICIAN:  Betha LoaKevin Brandy Zuba, MD        DATE OF BIRTH:  12-15-1960  DATE OF PROCEDURE:  06/09/2017 DATE OF DISCHARGE:                              OPERATIVE REPORT   PREOPERATIVE DIAGNOSIS:  Right small finger flexor tendon rupture after repair.  POSTOPERATIVE DIAGNOSIS:  Right small finger flexor tendon rupture after repair.  PROCEDURE:  Right small finger flexor tendon reconstruction with FDS graft.  SURGEON:  Betha LoaKevin Miranda Garber, MD.  ASSISTANT:  Cindee SaltGary Livian Vanderbeck, MD.  ANESTHESIA:  General with regional.  IV FLUIDS:  Per anesthesia flow sheet.  ESTIMATED BLOOD LOSS:  Minimal.  COMPLICATIONS:  None.  SPECIMENS:  None.  TOURNIQUET TIME:  86 minutes.  DISPOSITION:  Stable to PACU.  INDICATIONS:  Mr. Daphine DeutscherMartin is a 56 year old male, who approximately 2-3 weeks ago underwent surgical repair of right small finger FDS and FDP lacerations as well as radial digital nerve laceration.  The repair subsequently failed.  He wishes to proceed with reconstruction of the flexor tendon.  Risks, benefits, and alternatives of the surgery were discussed including the risk of blood loss; infection; damage to nerves, vessels, tendons, ligaments, bone; failure of surgery; need for additional surgery; complications with wound healing; continued pain and stiffness.  He voiced understanding of these risks and elected to proceed.  OPERATIVE COURSE:  After being identified preoperatively by myself, the patient and I agreed upon procedure and site of procedure.  Surgical site was marked.  Risks, benefits, and alternatives of surgery were reviewed, and he wished to proceed.  Surgical consent had been signed. He was given IV Ancef as preoperative antibiotic prophylaxis.  He was transferred to the operating room and placed on the operating room table in supine  position with the right upper extremity on arm board.  A regional block had been performed by Anesthesia in preoperative holding. General anesthesia was induced in the operating room.  Right upper extremity was prepped and draped in normal sterile orthopedic fashion. Surgical pause was performed between surgeons, anesthesia, and operating room staff, and all were in agreement as to the patient, procedure, and site of procedure.  Tourniquet at the proximal aspect of the extremity was inflated to 250 mmHg after exsanguination of the limb with an Esmarch bandage.  The previous incision was followed.  This was extended proximally.  The radial digital nerve was kept outside the zone of the surgery and protected.  The FDS and FDP tendon repairs were noted to have failed.  The sutures appeared to have pulled out distally.  The proximal tendon stumps were found and the remaining suture removed.  The A1 pulley was released for repair.  The sheath was able to be kept open enough to do a single stage reconstruction.  It was felt that harvesting of the FDS tendon would be appropriate for the grafting.  An incision was made over the distal phalanx and the FDP stump found here and pulled through the wound.  The FDS was harvested through a separate incision at the  wrist.  The wrist wound was then irrigated and closed with 4-0 nylon in horizontal mattress fashion.  The FDS tendon was sewn with a 2-0 Prolene suture in a pull-out fashion.  This was then passed around the distal phalanx and through the nail dorsally.  This was tied over a button, which was padded.  Good approximation of the tendon down to the distal phalanx was achieved.  The remaining stump of the FDP tendon was then cut short with coverage over the graft end.  The distal portion of the wound was closed with 4-0 nylon in a horizontal mattress fashion. The tendon was placed through the sheath and the Pulvertaft weave was used to tie the  tendon graft under appropriate tension to the stump of the FDP tendon.  This was sewn with FiberWire suture in a horizontal mattress fashion.  The finger was in good cascade and slightly tighter in the adjacent digits.  This was to allow for any stretching.  The wound was irrigated with sterile saline and closed with 4-0 nylon in a horizontal mattress fashion.  It was then dressed with sterile Xeroform, 4x4s, and wrapped with a Kerlix bandage.  A dorsal blocking splint was placed with the wrist at approximately 30-40 degrees of flexion.  The MPs flexed and the IPs extended.  This was wrapped with Kerlix and Ace bandage.  Tourniquet was deflated at 86 minutes.  Fingertips were pink with brisk capillary refill after deflation of tourniquet.  Operative drapes were broken down.  The patient was awoken from anesthesia safely. He was transferred back to the stretcher and taken to PACU in stable condition.  I will see him back in the office in 1 week for postoperative followup.  I will give him Percocet 5/325 one to two p.o. q.6 hours p.r.n. pain, dispensed #20.     Betha LoaKevin Dailyn Kempner, MD     KK/MEDQ  D:  06/09/2017  T:  06/09/2017  Job:  [id number]

## 2017-06-09 NOTE — Transfer of Care (Signed)
Immediate Anesthesia Transfer of Care Note  Patient: Edward Chapman  Procedure(s) Performed: RIGHT FLEXOR TENDON REPAIR RECONSTRUCTION (Right Hand)  Patient Location: PACU  Anesthesia Type:General  Level of Consciousness: awake, sedated and patient cooperative  Airway & Oxygen Therapy: Patient Spontanous Breathing and Patient connected to face mask oxygen  Post-op Assessment: Report given to RN and Post -op Vital signs reviewed and stable  Post vital signs: Reviewed and stable  Last Vitals:  Vitals:   06/09/17 1230 06/09/17 1235  BP: 135/89 132/85  Pulse: 79 86  Resp: 17 16  Temp:    SpO2: 98% 98%    Last Pain:  Vitals:   06/09/17 1235  TempSrc:   PainSc: 0-No pain         Complications: No apparent anesthesia complications

## 2017-06-09 NOTE — Op Note (Signed)
[id number] 

## 2017-06-09 NOTE — Progress Notes (Addendum)
Assisted Dr. Chaney MallingHodierne with left, ultrasound guided, interscalene brachial plexus block. Side rails up, monitors on throughout procedure. See vital signs in flow sheet. Tolerated Procedure well.

## 2017-06-09 NOTE — Op Note (Signed)
I assisted Surgeon(s) and Role:    * Betha LoaKuzma, Kevin, MD - Primary    Cindee Salt* Marcelle Hepner, MD - Assisting on the Procedure(s): RIGHT FLEXOR TENDON REPAIR RECONSTRUCTION on 06/09/2017.  I provided assistance on this case as follows: setup, exploration, harvesting the graft, reconstrution,closure of the wound.  Electronically signed by: Nicki ReaperKUZMA,Denicia Pagliarulo R, MD Date: 06/09/2017 Time: 3:17 PM

## 2017-06-09 NOTE — Anesthesia Procedure Notes (Addendum)
Anesthesia Regional Block: Interscalene brachial plexus block   Pre-Anesthetic Checklist: ,, timeout performed, Correct Patient, Correct Site, Correct Laterality, Correct Procedure, Correct Position, site marked, Risks and benefits discussed,  Surgical consent,  Pre-op evaluation,  At surgeon's request and post-op pain management  Laterality: Right  Prep: chloraprep       Needles:  Injection technique: Single-shot  Needle Type: Echogenic Stimulator Needle     Needle Length: 5cm  Needle Gauge: 22     Additional Needles:   Procedures:, nerve stimulator,,,,,,,   Nerve Stimulator or Paresthesia:  Response: biceps flexion, 0.45 mA,   Additional Responses:   Narrative:  Start time: 06/09/2017 12:18 PM End time: 06/09/2017 12:24 PM Injection made incrementally with aspirations every 5 mL.  Performed by: Personally  Anesthesiologist: Achille RichHodierne, Martavis Gurney, MD  Additional Notes: Functioning IV was confirmed and monitors were applied.  A 50mm 22ga Arrow echogenic stimulator needle was used. Sterile prep and drape,hand hygiene and sterile gloves were used.  Negative aspiration and negative test dose prior to incremental administration of local anesthetic. The patient tolerated the procedure well.  Ultrasound guidance: relevent anatomy identified, needle position confirmed, local anesthetic spread visualized around nerve(s), vascular puncture avoided.  Image printed for medical record.

## 2017-06-10 ENCOUNTER — Encounter (HOSPITAL_BASED_OUTPATIENT_CLINIC_OR_DEPARTMENT_OTHER): Payer: Self-pay | Admitting: Orthopedic Surgery

## 2017-06-10 NOTE — Anesthesia Postprocedure Evaluation (Signed)
Anesthesia Post Note  Patient: Edward Chapman  Procedure(s) Performed: RIGHT FLEXOR TENDON REPAIR RECONSTRUCTION (Right Hand)     Patient location during evaluation: PACU Anesthesia Type: General Level of consciousness: awake and alert Pain management: pain level controlled Vital Signs Assessment: post-procedure vital signs reviewed and stable Respiratory status: spontaneous breathing, nonlabored ventilation, respiratory function stable and patient connected to nasal cannula oxygen Cardiovascular status: blood pressure returned to baseline and stable Postop Assessment: no apparent nausea or vomiting Anesthetic complications: no    Last Vitals:  Vitals:   06/09/17 1615 06/09/17 1651  BP: (!) 128/93 130/81  Pulse: 71 70  Resp: 16   Temp:  36.5 C  SpO2: 98% 95%    Last Pain:  Vitals:   06/09/17 1651  TempSrc: Oral  PainSc: 0-No pain                 Cecile HearingStephen Edward Turk

## 2017-06-10 NOTE — Anesthesia Postprocedure Evaluation (Deleted)
Anesthesia Post Note  Patient: Edward Chapman  Procedure(s) Performed: RIGHT FLEXOR TENDON REPAIR RECONSTRUCTION (Right Hand)     Patient location during evaluation: PACU Anesthesia Type: General and Regional Level of consciousness: awake and alert Pain management: pain level controlled Vital Signs Assessment: post-procedure vital signs reviewed and stable Respiratory status: spontaneous breathing, nonlabored ventilation, respiratory function stable and patient connected to nasal cannula oxygen Cardiovascular status: blood pressure returned to baseline and stable Postop Assessment: no apparent nausea or vomiting Anesthetic complications: no    Last Vitals:  Vitals:   06/09/17 1615 06/09/17 1651  BP: (!) 128/93 130/81  Pulse: 71 70  Resp: 16   Temp:  36.5 C  SpO2: 98% 95%    Last Pain:  Vitals:   06/09/17 1651  TempSrc: Oral  PainSc: 0-No pain                 Floyd Lusignan S

## 2017-06-15 DIAGNOSIS — M79644 Pain in right finger(s): Secondary | ICD-10-CM | POA: Diagnosis not present

## 2017-06-15 DIAGNOSIS — S64496D Injury of digital nerve of right little finger, subsequent encounter: Secondary | ICD-10-CM | POA: Diagnosis not present

## 2017-06-15 DIAGNOSIS — S66126D Laceration of flexor muscle, fascia and tendon of right little finger at wrist and hand level, subsequent encounter: Secondary | ICD-10-CM | POA: Diagnosis not present

## 2017-06-15 DIAGNOSIS — S65516D Laceration of blood vessel of right little finger, subsequent encounter: Secondary | ICD-10-CM | POA: Diagnosis not present

## 2017-06-16 DIAGNOSIS — F4010 Social phobia, unspecified: Secondary | ICD-10-CM | POA: Diagnosis not present

## 2017-06-22 DIAGNOSIS — M25641 Stiffness of right hand, not elsewhere classified: Secondary | ICD-10-CM | POA: Diagnosis not present

## 2017-06-22 DIAGNOSIS — S64496D Injury of digital nerve of right little finger, subsequent encounter: Secondary | ICD-10-CM | POA: Diagnosis not present

## 2017-06-22 DIAGNOSIS — S65516D Laceration of blood vessel of right little finger, subsequent encounter: Secondary | ICD-10-CM | POA: Diagnosis not present

## 2017-06-22 DIAGNOSIS — M79644 Pain in right finger(s): Secondary | ICD-10-CM | POA: Diagnosis not present

## 2017-06-22 DIAGNOSIS — S66126D Laceration of flexor muscle, fascia and tendon of right little finger at wrist and hand level, subsequent encounter: Secondary | ICD-10-CM | POA: Diagnosis not present

## 2017-07-08 DIAGNOSIS — S64496D Injury of digital nerve of right little finger, subsequent encounter: Secondary | ICD-10-CM | POA: Diagnosis not present

## 2017-07-08 DIAGNOSIS — S65516D Laceration of blood vessel of right little finger, subsequent encounter: Secondary | ICD-10-CM | POA: Diagnosis not present

## 2017-07-08 DIAGNOSIS — M79644 Pain in right finger(s): Secondary | ICD-10-CM | POA: Diagnosis not present

## 2017-07-08 DIAGNOSIS — S66126D Laceration of flexor muscle, fascia and tendon of right little finger at wrist and hand level, subsequent encounter: Secondary | ICD-10-CM | POA: Diagnosis not present

## 2017-07-08 DIAGNOSIS — M25641 Stiffness of right hand, not elsewhere classified: Secondary | ICD-10-CM | POA: Diagnosis not present

## 2017-07-12 DIAGNOSIS — F4010 Social phobia, unspecified: Secondary | ICD-10-CM | POA: Diagnosis not present

## 2017-07-12 DIAGNOSIS — S65516D Laceration of blood vessel of right little finger, subsequent encounter: Secondary | ICD-10-CM | POA: Diagnosis not present

## 2017-07-12 DIAGNOSIS — S66126D Laceration of flexor muscle, fascia and tendon of right little finger at wrist and hand level, subsequent encounter: Secondary | ICD-10-CM | POA: Diagnosis not present

## 2017-07-12 DIAGNOSIS — M79644 Pain in right finger(s): Secondary | ICD-10-CM | POA: Diagnosis not present

## 2017-07-12 DIAGNOSIS — M25641 Stiffness of right hand, not elsewhere classified: Secondary | ICD-10-CM | POA: Diagnosis not present

## 2017-07-12 DIAGNOSIS — S64496D Injury of digital nerve of right little finger, subsequent encounter: Secondary | ICD-10-CM | POA: Diagnosis not present

## 2017-07-20 DIAGNOSIS — S65516D Laceration of blood vessel of right little finger, subsequent encounter: Secondary | ICD-10-CM | POA: Diagnosis not present

## 2017-07-20 DIAGNOSIS — S64496D Injury of digital nerve of right little finger, subsequent encounter: Secondary | ICD-10-CM | POA: Diagnosis not present

## 2017-07-20 DIAGNOSIS — S66126D Laceration of flexor muscle, fascia and tendon of right little finger at wrist and hand level, subsequent encounter: Secondary | ICD-10-CM | POA: Diagnosis not present

## 2017-07-21 DIAGNOSIS — S64496D Injury of digital nerve of right little finger, subsequent encounter: Secondary | ICD-10-CM | POA: Diagnosis not present

## 2017-07-21 DIAGNOSIS — S65516D Laceration of blood vessel of right little finger, subsequent encounter: Secondary | ICD-10-CM | POA: Diagnosis not present

## 2017-07-21 DIAGNOSIS — M25641 Stiffness of right hand, not elsewhere classified: Secondary | ICD-10-CM | POA: Diagnosis not present

## 2017-07-21 DIAGNOSIS — M79644 Pain in right finger(s): Secondary | ICD-10-CM | POA: Diagnosis not present

## 2017-07-21 DIAGNOSIS — S66126D Laceration of flexor muscle, fascia and tendon of right little finger at wrist and hand level, subsequent encounter: Secondary | ICD-10-CM | POA: Diagnosis not present

## 2017-07-26 DIAGNOSIS — F4010 Social phobia, unspecified: Secondary | ICD-10-CM | POA: Diagnosis not present

## 2017-07-28 DIAGNOSIS — S65516D Laceration of blood vessel of right little finger, subsequent encounter: Secondary | ICD-10-CM | POA: Diagnosis not present

## 2017-07-28 DIAGNOSIS — M25641 Stiffness of right hand, not elsewhere classified: Secondary | ICD-10-CM | POA: Diagnosis not present

## 2017-07-28 DIAGNOSIS — M79644 Pain in right finger(s): Secondary | ICD-10-CM | POA: Diagnosis not present

## 2017-07-28 DIAGNOSIS — S66126D Laceration of flexor muscle, fascia and tendon of right little finger at wrist and hand level, subsequent encounter: Secondary | ICD-10-CM | POA: Diagnosis not present

## 2017-07-28 DIAGNOSIS — S64496D Injury of digital nerve of right little finger, subsequent encounter: Secondary | ICD-10-CM | POA: Diagnosis not present

## 2017-08-02 DIAGNOSIS — S66126D Laceration of flexor muscle, fascia and tendon of right little finger at wrist and hand level, subsequent encounter: Secondary | ICD-10-CM | POA: Diagnosis not present

## 2017-08-02 DIAGNOSIS — M79644 Pain in right finger(s): Secondary | ICD-10-CM | POA: Diagnosis not present

## 2017-08-02 DIAGNOSIS — M25641 Stiffness of right hand, not elsewhere classified: Secondary | ICD-10-CM | POA: Diagnosis not present

## 2017-08-02 DIAGNOSIS — S64496D Injury of digital nerve of right little finger, subsequent encounter: Secondary | ICD-10-CM | POA: Diagnosis not present

## 2017-08-02 DIAGNOSIS — S65516D Laceration of blood vessel of right little finger, subsequent encounter: Secondary | ICD-10-CM | POA: Diagnosis not present

## 2017-08-08 ENCOUNTER — Other Ambulatory Visit: Payer: Self-pay | Admitting: Orthopedic Surgery

## 2017-08-08 DIAGNOSIS — S66126D Laceration of flexor muscle, fascia and tendon of right little finger at wrist and hand level, subsequent encounter: Secondary | ICD-10-CM

## 2017-08-08 DIAGNOSIS — S64496D Injury of digital nerve of right little finger, subsequent encounter: Secondary | ICD-10-CM

## 2017-08-09 DIAGNOSIS — F900 Attention-deficit hyperactivity disorder, predominantly inattentive type: Secondary | ICD-10-CM | POA: Diagnosis not present

## 2017-08-09 DIAGNOSIS — R740 Nonspecific elevation of levels of transaminase and lactic acid dehydrogenase [LDH]: Secondary | ICD-10-CM | POA: Diagnosis not present

## 2017-08-09 DIAGNOSIS — F4010 Social phobia, unspecified: Secondary | ICD-10-CM | POA: Diagnosis not present

## 2017-08-11 DIAGNOSIS — S66126D Laceration of flexor muscle, fascia and tendon of right little finger at wrist and hand level, subsequent encounter: Secondary | ICD-10-CM | POA: Diagnosis not present

## 2017-08-11 DIAGNOSIS — M25641 Stiffness of right hand, not elsewhere classified: Secondary | ICD-10-CM | POA: Diagnosis not present

## 2017-08-11 DIAGNOSIS — S65516D Laceration of blood vessel of right little finger, subsequent encounter: Secondary | ICD-10-CM | POA: Diagnosis not present

## 2017-08-11 DIAGNOSIS — S64496D Injury of digital nerve of right little finger, subsequent encounter: Secondary | ICD-10-CM | POA: Diagnosis not present

## 2017-08-17 ENCOUNTER — Other Ambulatory Visit: Payer: BLUE CROSS/BLUE SHIELD

## 2017-08-18 DIAGNOSIS — S65516D Laceration of blood vessel of right little finger, subsequent encounter: Secondary | ICD-10-CM | POA: Diagnosis not present

## 2017-08-18 DIAGNOSIS — S66126D Laceration of flexor muscle, fascia and tendon of right little finger at wrist and hand level, subsequent encounter: Secondary | ICD-10-CM | POA: Diagnosis not present

## 2017-08-18 DIAGNOSIS — M25641 Stiffness of right hand, not elsewhere classified: Secondary | ICD-10-CM | POA: Diagnosis not present

## 2017-08-18 DIAGNOSIS — S64496D Injury of digital nerve of right little finger, subsequent encounter: Secondary | ICD-10-CM | POA: Diagnosis not present

## 2017-08-19 ENCOUNTER — Ambulatory Visit
Admission: RE | Admit: 2017-08-19 | Discharge: 2017-08-19 | Disposition: A | Discharge status: Home / Self Care | Payer: BLUE CROSS/BLUE SHIELD | Source: Ambulatory Visit | Attending: Orthopedic Surgery | Admitting: Orthopedic Surgery

## 2017-08-19 DIAGNOSIS — S66126D Laceration of flexor muscle, fascia and tendon of right little finger at wrist and hand level, subsequent encounter: Secondary | ICD-10-CM

## 2017-08-19 DIAGNOSIS — M7989 Other specified soft tissue disorders: Secondary | ICD-10-CM | POA: Diagnosis not present

## 2017-08-19 DIAGNOSIS — S64496D Injury of digital nerve of right little finger, subsequent encounter: Secondary | ICD-10-CM

## 2017-08-22 DIAGNOSIS — F4010 Social phobia, unspecified: Secondary | ICD-10-CM | POA: Diagnosis not present

## 2017-08-23 DIAGNOSIS — R740 Nonspecific elevation of levels of transaminase and lactic acid dehydrogenase [LDH]: Secondary | ICD-10-CM | POA: Diagnosis not present

## 2017-08-25 DIAGNOSIS — S64496D Injury of digital nerve of right little finger, subsequent encounter: Secondary | ICD-10-CM | POA: Diagnosis not present

## 2017-08-25 DIAGNOSIS — M25641 Stiffness of right hand, not elsewhere classified: Secondary | ICD-10-CM | POA: Diagnosis not present

## 2017-08-25 DIAGNOSIS — S66126D Laceration of flexor muscle, fascia and tendon of right little finger at wrist and hand level, subsequent encounter: Secondary | ICD-10-CM | POA: Diagnosis not present

## 2017-08-25 DIAGNOSIS — S65516D Laceration of blood vessel of right little finger, subsequent encounter: Secondary | ICD-10-CM | POA: Diagnosis not present

## 2017-08-30 DIAGNOSIS — S65516D Laceration of blood vessel of right little finger, subsequent encounter: Secondary | ICD-10-CM | POA: Diagnosis not present

## 2017-08-30 DIAGNOSIS — S64496D Injury of digital nerve of right little finger, subsequent encounter: Secondary | ICD-10-CM | POA: Diagnosis not present

## 2017-08-30 DIAGNOSIS — M25641 Stiffness of right hand, not elsewhere classified: Secondary | ICD-10-CM | POA: Diagnosis not present

## 2017-08-30 DIAGNOSIS — S66126D Laceration of flexor muscle, fascia and tendon of right little finger at wrist and hand level, subsequent encounter: Secondary | ICD-10-CM | POA: Diagnosis not present

## 2017-08-30 DIAGNOSIS — F4010 Social phobia, unspecified: Secondary | ICD-10-CM | POA: Diagnosis not present

## 2017-09-06 DIAGNOSIS — M25641 Stiffness of right hand, not elsewhere classified: Secondary | ICD-10-CM | POA: Diagnosis not present

## 2017-09-06 DIAGNOSIS — S64496D Injury of digital nerve of right little finger, subsequent encounter: Secondary | ICD-10-CM | POA: Diagnosis not present

## 2017-09-06 DIAGNOSIS — S66126D Laceration of flexor muscle, fascia and tendon of right little finger at wrist and hand level, subsequent encounter: Secondary | ICD-10-CM | POA: Diagnosis not present

## 2017-09-06 DIAGNOSIS — S65516D Laceration of blood vessel of right little finger, subsequent encounter: Secondary | ICD-10-CM | POA: Diagnosis not present

## 2017-09-13 DIAGNOSIS — F4010 Social phobia, unspecified: Secondary | ICD-10-CM | POA: Diagnosis not present

## 2017-09-15 DIAGNOSIS — M25641 Stiffness of right hand, not elsewhere classified: Secondary | ICD-10-CM | POA: Diagnosis not present

## 2017-09-22 DIAGNOSIS — R531 Weakness: Secondary | ICD-10-CM | POA: Diagnosis not present

## 2017-09-22 DIAGNOSIS — S64496D Injury of digital nerve of right little finger, subsequent encounter: Secondary | ICD-10-CM | POA: Diagnosis not present

## 2017-09-22 DIAGNOSIS — S66126D Laceration of flexor muscle, fascia and tendon of right little finger at wrist and hand level, subsequent encounter: Secondary | ICD-10-CM | POA: Diagnosis not present

## 2017-09-22 DIAGNOSIS — M25641 Stiffness of right hand, not elsewhere classified: Secondary | ICD-10-CM | POA: Diagnosis not present

## 2017-09-26 DIAGNOSIS — S66126D Laceration of flexor muscle, fascia and tendon of right little finger at wrist and hand level, subsequent encounter: Secondary | ICD-10-CM | POA: Diagnosis not present

## 2017-09-26 DIAGNOSIS — S64496D Injury of digital nerve of right little finger, subsequent encounter: Secondary | ICD-10-CM | POA: Diagnosis not present

## 2017-11-04 ENCOUNTER — Encounter (HOSPITAL_BASED_OUTPATIENT_CLINIC_OR_DEPARTMENT_OTHER): Payer: Self-pay | Admitting: *Deleted

## 2017-11-04 ENCOUNTER — Other Ambulatory Visit: Payer: Self-pay | Admitting: Orthopedic Surgery

## 2017-11-08 ENCOUNTER — Encounter (HOSPITAL_BASED_OUTPATIENT_CLINIC_OR_DEPARTMENT_OTHER)
Admission: RE | Disposition: A | Discharge status: Home / Self Care | Payer: Self-pay | Source: Ambulatory Visit | Attending: Orthopedic Surgery

## 2017-11-08 ENCOUNTER — Encounter (HOSPITAL_BASED_OUTPATIENT_CLINIC_OR_DEPARTMENT_OTHER): Payer: Self-pay | Admitting: Anesthesiology

## 2017-11-08 ENCOUNTER — Other Ambulatory Visit: Payer: Self-pay

## 2017-11-08 ENCOUNTER — Ambulatory Visit (HOSPITAL_BASED_OUTPATIENT_CLINIC_OR_DEPARTMENT_OTHER): Payer: PRIVATE HEALTH INSURANCE | Admitting: Certified Registered"

## 2017-11-08 ENCOUNTER — Ambulatory Visit (HOSPITAL_BASED_OUTPATIENT_CLINIC_OR_DEPARTMENT_OTHER)
Admission: RE | Admit: 2017-11-08 | Discharge: 2017-11-08 | Disposition: A | Discharge status: Home / Self Care | Payer: PRIVATE HEALTH INSURANCE | Source: Ambulatory Visit | Attending: Orthopedic Surgery | Admitting: Orthopedic Surgery

## 2017-11-08 DIAGNOSIS — M67843 Other specified disorders of tendon, right hand: Secondary | ICD-10-CM | POA: Insufficient documentation

## 2017-11-08 DIAGNOSIS — M65841 Other synovitis and tenosynovitis, right hand: Secondary | ICD-10-CM | POA: Insufficient documentation

## 2017-11-08 DIAGNOSIS — Z79899 Other long term (current) drug therapy: Secondary | ICD-10-CM | POA: Insufficient documentation

## 2017-11-08 DIAGNOSIS — M66341 Spontaneous rupture of flexor tendons, right hand: Secondary | ICD-10-CM | POA: Diagnosis present

## 2017-11-08 DIAGNOSIS — F988 Other specified behavioral and emotional disorders with onset usually occurring in childhood and adolescence: Secondary | ICD-10-CM | POA: Insufficient documentation

## 2017-11-08 HISTORY — PX: TENOLYSIS: SHX396

## 2017-11-08 SURGERY — INCISION, TENDON SHEATH
Anesthesia: Monitor Anesthesia Care | Site: Hand | Laterality: Right

## 2017-11-08 MED ORDER — FENTANYL CITRATE (PF) 100 MCG/2ML IJ SOLN
50.0000 ug | INTRAMUSCULAR | Status: DC | PRN
  Administered 2017-11-08: 100 ug via INTRAVENOUS

## 2017-11-08 MED ORDER — FENTANYL CITRATE (PF) 100 MCG/2ML IJ SOLN
INTRAMUSCULAR | Status: AC
  Filled 2017-11-08: qty 2

## 2017-11-08 MED ORDER — HYDROCODONE-ACETAMINOPHEN 5-325 MG PO TABS: ORAL_TABLET | ORAL | 0 refills | Status: DC

## 2017-11-08 MED ORDER — PROPOFOL 500 MG/50ML IV EMUL
INTRAVENOUS | Status: DC | PRN
  Administered 2017-11-08: 100 ug/kg/min via INTRAVENOUS

## 2017-11-08 MED ORDER — CEFAZOLIN SODIUM-DEXTROSE 2-4 GM/100ML-% IV SOLN
INTRAVENOUS | Status: AC
  Filled 2017-11-08: qty 100

## 2017-11-08 MED ORDER — CHLORHEXIDINE GLUCONATE 4 % EX LIQD: 60.0000 mL | Freq: Once | CUTANEOUS | Status: DC

## 2017-11-08 MED ORDER — LACTATED RINGERS IV SOLN
INTRAVENOUS | Status: DC
  Administered 2017-11-08: 13:00:00 via INTRAVENOUS

## 2017-11-08 MED ORDER — ONDANSETRON HCL 4 MG/2ML IJ SOLN
INTRAMUSCULAR | Status: DC | PRN
  Administered 2017-11-08: 4 mg via INTRAVENOUS

## 2017-11-08 MED ORDER — BUPIVACAINE-EPINEPHRINE (PF) 0.5% -1:200000 IJ SOLN
INTRAMUSCULAR | Status: DC | PRN
  Administered 2017-11-08: 30 mL via PERINEURAL

## 2017-11-08 MED ORDER — MIDAZOLAM HCL 2 MG/2ML IJ SOLN
INTRAMUSCULAR | Status: AC
  Filled 2017-11-08: qty 2

## 2017-11-08 MED ORDER — MEPERIDINE HCL 25 MG/ML IJ SOLN
6.2500 mg | INTRAMUSCULAR | Status: DC | PRN
Start: 2017-11-08 — End: 2017-11-08

## 2017-11-08 MED ORDER — SCOPOLAMINE 1 MG/3DAYS TD PT72: 1.0000 | MEDICATED_PATCH | Freq: Once | TRANSDERMAL | Status: DC | PRN

## 2017-11-08 MED ORDER — HYDROCODONE-ACETAMINOPHEN 7.5-325 MG PO TABS: 1.0000 | ORAL_TABLET | Freq: Once | ORAL | Status: DC | PRN

## 2017-11-08 MED ORDER — FENTANYL CITRATE (PF) 100 MCG/2ML IJ SOLN: 25.0000 ug | INTRAMUSCULAR | Status: DC | PRN

## 2017-11-08 MED ORDER — CEFAZOLIN SODIUM-DEXTROSE 2-4 GM/100ML-% IV SOLN
2.0000 g | INTRAVENOUS | Status: AC
  Administered 2017-11-08: 2 g via INTRAVENOUS

## 2017-11-08 MED ORDER — LIDOCAINE HCL (CARDIAC) PF 100 MG/5ML IV SOSY
PREFILLED_SYRINGE | INTRAVENOUS | Status: DC | PRN
  Administered 2017-11-08: 30 mg via INTRAVENOUS

## 2017-11-08 MED ORDER — MIDAZOLAM HCL 2 MG/2ML IJ SOLN
1.0000 mg | INTRAMUSCULAR | Status: DC | PRN
  Administered 2017-11-08: 2 mg via INTRAVENOUS

## 2017-11-08 MED ORDER — PROMETHAZINE HCL 25 MG/ML IJ SOLN: 6.2500 mg | INTRAMUSCULAR | Status: DC | PRN

## 2017-11-08 SURGICAL SUPPLY — 80 items
BAG DECANTER FOR FLEXI CONT (MISCELLANEOUS) IMPLANT
BALL CTTN LRG ABS STRL LF (GAUZE/BANDAGES/DRESSINGS)
BANDAGE ACE 3X5.8 VEL STRL LF (GAUZE/BANDAGES/DRESSINGS) ×4 IMPLANT
BLADE MINI RND TIP GREEN BEAV (BLADE) IMPLANT
BLADE SURG 15 STRL LF DISP TIS (BLADE) ×5 IMPLANT
BLADE SURG 15 STRL SS (BLADE) ×12
BNDG CMPR 9X4 STRL LF SNTH (GAUZE/BANDAGES/DRESSINGS) ×2
BNDG CONFORM 2 STRL LF (GAUZE/BANDAGES/DRESSINGS) IMPLANT
BNDG ELASTIC 2X5.8 VLCR STR LF (GAUZE/BANDAGES/DRESSINGS) IMPLANT
BNDG ESMARK 4X9 LF (GAUZE/BANDAGES/DRESSINGS) ×4 IMPLANT
BNDG GAUZE ELAST 4 BULKY (GAUZE/BANDAGES/DRESSINGS) ×4 IMPLANT
CATH ROBINSON RED A/P 10FR (CATHETERS) IMPLANT
CHLORAPREP W/TINT 26ML (MISCELLANEOUS) ×4 IMPLANT
CORD BIPOLAR FORCEPS 12FT (ELECTRODE) ×4 IMPLANT
COTTONBALL LRG STERILE PKG (GAUZE/BANDAGES/DRESSINGS) IMPLANT
COVER BACK TABLE 60X90IN (DRAPES) ×4 IMPLANT
COVER MAYO STAND STRL (DRAPES) ×4 IMPLANT
CUFF TOURNIQUET SINGLE 18IN (TOURNIQUET CUFF) ×4 IMPLANT
DECANTER SPIKE VIAL GLASS SM (MISCELLANEOUS) IMPLANT
DRAIN TLS ROUND 10FR (DRAIN) IMPLANT
DRAPE EXTREMITY T 121X128X90 (DRAPE) ×4 IMPLANT
DRAPE OEC MINIVIEW 54X84 (DRAPES) IMPLANT
DRAPE SURG 17X23 STRL (DRAPES) ×4 IMPLANT
DRSG PAD ABDOMINAL 8X10 ST (GAUZE/BANDAGES/DRESSINGS) IMPLANT
GAUZE SPONGE 4X4 12PLY STRL (GAUZE/BANDAGES/DRESSINGS) ×4 IMPLANT
GAUZE SPONGE 4X4 16PLY XRAY LF (GAUZE/BANDAGES/DRESSINGS) IMPLANT
GAUZE XEROFORM 1X8 LF (GAUZE/BANDAGES/DRESSINGS) ×4 IMPLANT
GLOVE BIO SURGEON STRL SZ7.5 (GLOVE) ×4 IMPLANT
GLOVE BIOGEL PI IND STRL 7.0 (GLOVE) ×2 IMPLANT
GLOVE BIOGEL PI IND STRL 8 (GLOVE) ×2 IMPLANT
GLOVE BIOGEL PI IND STRL 8.5 (GLOVE) ×1 IMPLANT
GLOVE BIOGEL PI INDICATOR 7.0 (GLOVE) ×4
GLOVE BIOGEL PI INDICATOR 8 (GLOVE) ×2
GLOVE BIOGEL PI INDICATOR 8.5 (GLOVE) ×2
GLOVE ECLIPSE 6.5 STRL STRAW (GLOVE) ×3 IMPLANT
GLOVE SURG ORTHO 8.0 STRL STRW (GLOVE) ×3 IMPLANT
GOWN STRL REUS W/ TWL LRG LVL3 (GOWN DISPOSABLE) ×2 IMPLANT
GOWN STRL REUS W/TWL LRG LVL3 (GOWN DISPOSABLE) ×4
GOWN STRL REUS W/TWL XL LVL3 (GOWN DISPOSABLE) ×7 IMPLANT
K-WIRE .035X4 (WIRE) IMPLANT
LOOP VESSEL MAXI BLUE (MISCELLANEOUS) IMPLANT
NEEDLE HYPO 25X1 1.5 SAFETY (NEEDLE) IMPLANT
NEEDLE KEITH (NEEDLE) IMPLANT
NS IRRIG 1000ML POUR BTL (IV SOLUTION) ×4 IMPLANT
PACK BASIN DAY SURGERY FS (CUSTOM PROCEDURE TRAY) ×4 IMPLANT
PAD CAST 3X4 CTTN HI CHSV (CAST SUPPLIES) ×2 IMPLANT
PAD CAST 4YDX4 CTTN HI CHSV (CAST SUPPLIES) IMPLANT
PADDING CAST ABS 3INX4YD NS (CAST SUPPLIES)
PADDING CAST ABS 4INX4YD NS (CAST SUPPLIES) ×2
PADDING CAST ABS COTTON 3X4 (CAST SUPPLIES) IMPLANT
PADDING CAST ABS COTTON 4X4 ST (CAST SUPPLIES) ×2 IMPLANT
PADDING CAST COTTON 3X4 STRL (CAST SUPPLIES) ×4
PADDING CAST COTTON 4X4 STRL (CAST SUPPLIES)
SLEEVE SCD COMPRESS KNEE MED (MISCELLANEOUS) IMPLANT
SLING ARM FOAM STRAP XLG (SOFTGOODS) ×4 IMPLANT
SPLINT PLASTER CAST XFAST 3X15 (CAST SUPPLIES) ×10 IMPLANT
SPLINT PLASTER CAST XFAST 4X15 (CAST SUPPLIES) IMPLANT
SPLINT PLASTER XTRA FAST SET 4 (CAST SUPPLIES)
SPLINT PLASTER XTRA FASTSET 3X (CAST SUPPLIES) ×20
STOCKINETTE 4X48 STRL (DRAPES) ×4 IMPLANT
SUT CHROMIC 5 0 P 3 (SUTURE) IMPLANT
SUT ETHIBOND 3-0 V-5 (SUTURE) IMPLANT
SUT ETHILON 3 0 PS 1 (SUTURE) IMPLANT
SUT ETHILON 4 0 PS 2 18 (SUTURE) ×6 IMPLANT
SUT FIBERWIRE 4-0 18 DIAM BLUE (SUTURE)
SUT MERSILENE 2.0 SH NDLE (SUTURE) IMPLANT
SUT MERSILENE 4 0 P 3 (SUTURE) IMPLANT
SUT PROLENE 2 0 SH DA (SUTURE) IMPLANT
SUT PROLENE 5 0 P 3 (SUTURE) IMPLANT
SUT SILK 4 0 PS 2 (SUTURE) ×3 IMPLANT
SUT SUPRAMID 4-0 (SUTURE) IMPLANT
SUT VIC AB 4-0 P-3 18XBRD (SUTURE) IMPLANT
SUT VIC AB 4-0 P3 18 (SUTURE)
SUT VICRYL 4-0 PS2 18IN ABS (SUTURE) IMPLANT
SUTURE FIBERWR 4-0 18 DIA BLUE (SUTURE) IMPLANT
SYR BULB 3OZ (MISCELLANEOUS) ×4 IMPLANT
SYR CONTROL 10ML LL (SYRINGE) IMPLANT
TOWEL OR 17X24 6PK STRL BLUE (TOWEL DISPOSABLE) ×8 IMPLANT
TUBE FEEDING ENTERAL 5FR 16IN (TUBING) IMPLANT
UNDERPAD 30X30 (UNDERPADS AND DIAPERS) ×4 IMPLANT

## 2017-11-08 NOTE — Brief Op Note (Signed)
11/08/2017  2:30 PM  PATIENT:  Cathlean Sauer  57 y.o. male  PRE-OPERATIVE DIAGNOSIS:  RIGHT SMALL FLEXOR TENDON RUPTURE  POST-OPERATIVE DIAGNOSIS:  RIGHT SMALL FLEXOR TENDON adhesions  PROCEDURE:  Procedure(s): RIGHT SMALL FLEXOR TENOLYSIS (Right)  SURGEON:  Surgeon(s) and Role:    * Betha Loa, MD - Primary    * Cindee Salt, MD - Assisting  PHYSICIAN ASSISTANT:   ASSISTANTS: Cindee Salt, MD   ANESTHESIA:   regional and IV sedation  EBL:  Minimal   BLOOD ADMINISTERED:none  DRAINS: none   LOCAL MEDICATIONS USED:  NONE  SPECIMEN:  No Specimen  DISPOSITION OF SPECIMEN:  N/A  COUNTS:  YES  TOURNIQUET:   Total Tourniquet Time Documented: Upper Arm (Right) - 15 minutes Total: Upper Arm (Right) - 15 minutes   DICTATION: .Other Dictation: Dictation Number [id number]  PLAN OF CARE: Discharge to home after PACU  PATIENT DISPOSITION:  PACU - hemodynamically stable.

## 2017-11-08 NOTE — Progress Notes (Signed)
Assisted Dr. Foster with right, ultrasound guided, axillary block. Side rails up, monitors on throughout procedure. See vital signs in flow sheet. Tolerated Procedure well. 

## 2017-11-08 NOTE — Op Note (Signed)
I assisted Surgeon(s) and Role:    * Betha Loa, MD - Primary    Cindee Salt, MD - Assisting on the Procedure(s): RIGHT SMALL FLEXOR TENOLYSIS on 11/08/2017.  I provided assistance on this case as follows: setup approach, isolation of the the tendon tenolysis, closure and application of the dressings and splint.  Electronically signed by: Nicki Reaper, MD Date: 11/08/2017 Time: 2:32 PM

## 2017-11-08 NOTE — Op Note (Signed)
NAME: Edward Chapman, Edward Chapman MEDICAL RECORD [id number] ACCOUNT [id number] DATE OF BIRTH:09/26/1960 FACILITY: [facility] LOCATION: [facility] PHYSICIAN:Shiane Wenberg Georges Lynch, MD  OPERATIVE REPORT  DATE OF PROCEDURE:  11/08/2017  PREOPERATIVE DIAGNOSIS:  Right small finger flexor tendon rupture.  POSTOPERATIVE DIAGNOSIS:  Right small finger flexor tendon adhesions.  PROCEDURE PERFORMED:  Right small finger flexor tenolysis through flexor sheath and in palm.  SURGEON:  Betha Loa, MD  ASSISTANT:  Cindee Salt, MD   ANESTHESIA:  Regional with sedation.  IV FLUIDS:  Per anesthesia flow sheet.  ESTIMATED BLOOD LOSS:  Minimal.  COMPLICATIONS:  None.  SPECIMENS:  None.  TOURNIQUET TIME:  75 minutes.  DISPOSITION:  Stable to PACU.  INDICATIONS:  The patient is a 57 year old male who has undergone repair and reconstruction of right small finger flexor tendon.  He has had inability to flex the small finger.  He presents today for stage I flexor tendon reconstruction.  Risks, benefits  and alternatives of surgery were discussed including the risk of blood loss, infection, damage to nerves, vessels, tendons, ligaments, bone, failure of surgery, need for additional surgery, complications with wound healing, continued pain, continued  stiffness.  He voiced understanding of these risks and elected to proceed.  DESCRIPTION OF PROCEDURE:  After being identified preoperatively by myself, the patient and I agreed upon the procedure and site of procedure.  Surgical site was marked.  Risks, benefits and alternatives of surgery were reviewed and he wished to proceed.   Surgical consent had been signed.  He was given IV Ancef as preoperative antibiotic prophylaxis.  He was transferred to the operating room and placed on the operating room table in supine position with the right upper extremity on an armboard.  A  regional block had been performed by anesthesia in preoperative holding.  Right upper extremity was  prepped and draped in normal sterile orthopedic fashion.  A surgical pause was performed between the surgeons, anesthesia and operating room staff and all  agreed with the patient, procedure and site of procedure.  Tourniquet placed proximal aspect of the extremity and inflated to 250 mmHg after exsanguination with an Esmarch bandage.  The previous incision was followed.  It was carried in subcutaneous  tissues by spreading technique.  There was significant scar formation.  The radial neurovascular bundle was identified proximally and traced as it entered the scar tissue.  It was left intact.  Care was taken to protect the radial and ulnar digital  neurovascular bundles throughout the case.  The flexor sheath was identified.  The flexor tendon was intact within the sheath over the proximal phalanx.  There were tendon adhesions within it.  It was placed under traction.  There was no flexion at the  DIP joint.  The incision was extended both proximally and distally.  An incision was made over the distal phalanx and carried in subcutaneous tissue by spreading technique.  The flexor tendon was identified at the distal phalanx and had good insertion.   A combination of Zenovia Jordan, Kleinert-Kutz and tenolysis knives were then used to free up the tendon of all adhesions within the flexor tendon sheath.  Traction on the tendon at the proximal phalanx produced flexion at the DIP joint and PIP joints.   The tendon was then traced proximally into the palm.  The wound was again extended.  There was significant scarring around the Pulvertaft weave in the palm.  This was all freed up.  Traction in this area produced full flexion of the PIP  and DIP joints.   Traction proximally did not reveal any adhesion more proximally.  An incision was not made at the wrist.  The wounds were copiously irrigated with sterile saline and closed with 4-0 nylon in a horizontal mattress fashion.  They were dressed with sterile  Xeroform, 4  x 4's, and wrapped with a Kerlix bandage.  A dorsal splint was placed and wrapped with Kerlix and Ace bandage.  Tourniquet was deflated at 75 minutes.  All fingertips were pink with brisk capillary refill after deflation of the tourniquet.   The operative drapes were broken down.  The patient was awoken from anesthesia safely.  He was transferred back to a stretcher and taken to PACU in stable condition.  I will see him back in the office tomorrow to restart therapy to prevent tendon  adhesion.  We will give him a prescription for Norco 5/325 one to two p.o. q.6 hours p.r.n. pain, dispense #20.  GN/NUANCE  D:11/08/2017 T:11/08/2017 JOB:[id number]

## 2017-11-08 NOTE — Transfer of Care (Signed)
Immediate Anesthesia Transfer of Care Note  Patient: Edward Chapman  Procedure(s) Performed: RIGHT SMALL FLEXOR TENOLYSIS (Right Hand)  Patient Location: PACU  Anesthesia Type:MAC combined with regional for post-op pain  Level of Consciousness: awake, alert , oriented and patient cooperative  Airway & Oxygen Therapy: Patient Spontanous Breathing and Patient connected to face mask oxygen  Post-op Assessment: Report given to RN and Post -op Vital signs reviewed and stable  Post vital signs: Reviewed and stable  Last Vitals:  Vitals Value Taken Time  BP    Temp    Pulse    Resp    SpO2      Last Pain:  Vitals:   11/08/17 1222  TempSrc: Oral         Complications: No apparent anesthesia complications

## 2017-11-08 NOTE — Anesthesia Procedure Notes (Signed)
Procedure Name: MAC Date/Time: 11/08/2017 1:12 PM Performed by: Signe Colt, CRNA Pre-anesthesia Checklist: Patient identified, Emergency Drugs available, Suction available, Patient being monitored and Timeout performed Patient Re-evaluated:Patient Re-evaluated prior to induction Oxygen Delivery Method: Simple face mask

## 2017-11-08 NOTE — Anesthesia Preprocedure Evaluation (Signed)
Anesthesia Evaluation  Patient identified by MRN, date of birth, ID band Patient awake    Reviewed: Allergy & Precautions, NPO status , Patient's Chart, lab work & pertinent test results  Airway Mallampati: II  TM Distance: >3 FB     Dental no notable dental hx. (+) Teeth Intact, Dental Advisory Given   Pulmonary neg pulmonary ROS,    Pulmonary exam normal breath sounds clear to auscultation       Cardiovascular negative cardio ROS Normal cardiovascular exam Rhythm:Regular Rate:Normal     Neuro/Psych PSYCHIATRIC DISORDERS ADDnegative neurological ROS     GI/Hepatic negative GI ROS, Neg liver ROS,   Endo/Other  negative endocrine ROS  Renal/GU negative Renal ROS  negative genitourinary   Musculoskeletal Flexor tendon rupture right small finger   Abdominal   Peds  Hematology negative hematology ROS (+)   Anesthesia Other Findings   Reproductive/Obstetrics                             Anesthesia Physical Anesthesia Plan  ASA: II  Anesthesia Plan: Regional and MAC   Post-op Pain Management:    Induction:   PONV Risk Score and Plan: 1 and Ondansetron, Treatment may vary due to age or medical condition and Propofol infusion  Airway Management Planned: Nasal Cannula, Natural Airway and Simple Face Mask  Additional Equipment:   Intra-op Plan:   Post-operative Plan:   Informed Consent: I have reviewed the patients History and Physical, chart, labs and discussed the procedure including the risks, benefits and alternatives for the proposed anesthesia with the patient or authorized representative who has indicated his/her understanding and acceptance.   Dental advisory given  Plan Discussed with: CRNA, Anesthesiologist and Surgeon  Anesthesia Plan Comments:         Anesthesia Quick Evaluation

## 2017-11-08 NOTE — Anesthesia Procedure Notes (Signed)
Anesthesia Regional Block: Axillary brachial plexus block   Pre-Anesthetic Checklist: ,, timeout performed, Correct Patient, Correct Site, Correct Laterality, Correct Procedure, Correct Position, site marked, Risks and benefits discussed,  Surgical consent,  Pre-op evaluation,  At surgeon's request and post-op pain management  Laterality: Right  Prep: chloraprep       Needles:  Injection technique: Single-shot  Needle Type: Echogenic Stimulator Needle     Needle Length: 9cm  Needle Gauge: 21   Needle insertion depth: 7 cm   Additional Needles:   Procedures:,,,, ultrasound used (permanent image in chart),,,,  Narrative:  Start time: 11/08/2017 12:42 PM End time: 11/08/2017 12:47 PM Injection made incrementally with aspirations every 5 mL.  Performed by: Personally  Anesthesiologist: Mal Amabile, MD  Additional Notes: Timeout performed. Patient sedated. Relevant anatomy ID'd using [place]. Incremental 2-26ml injection of LA with frequent aspiration. Patient tolerated procedure well. o      Right Axillary Block

## 2017-11-08 NOTE — Op Note (Signed)
[id number] 

## 2017-11-08 NOTE — Anesthesia Postprocedure Evaluation (Signed)
Anesthesia Post Note  Patient: Edward Chapman  Procedure(s) Performed: RIGHT SMALL FLEXOR TENOLYSIS (Right Hand)     Patient location during evaluation: PACU Anesthesia Type: Regional and MAC Level of consciousness: awake and alert and oriented Pain management: pain level controlled Vital Signs Assessment: post-procedure vital signs reviewed and stable Respiratory status: spontaneous breathing, nonlabored ventilation and respiratory function stable Cardiovascular status: stable and blood pressure returned to baseline Postop Assessment: no apparent nausea or vomiting Anesthetic complications: no    Last Vitals:  Vitals:   11/08/17 1436 11/08/17 1445  BP: 127/85 (!) 139/96  Pulse: 75 70  Resp: 14 15  Temp: 36.5 C   SpO2: 99% 100%    Last Pain:  Vitals:   11/08/17 1222  TempSrc: Oral                 [name]

## 2017-11-08 NOTE — H&P (Signed)
  Edward Chapman is an 57 y.o. male.   Chief Complaint: Right small finger flexor tendon repair failure HPI: 57 year old male status post repair and reconstruction right small finger flexor tendon injury.  He has had continued inability to flex the finger with apparent failure of the reconstruction.  He wishes to undergo Hunter rod placement for stage I of two-stage repair.  Allergies: No Known Allergies  Past Medical History:  Diagnosis Date  . ADD (attention deficit disorder)     Past Surgical History:  Procedure Laterality Date  . FLEXOR TENDON REPAIR Right 06/09/2017   Procedure: RIGHT FLEXOR TENDON REPAIR RECONSTRUCTION;  Surgeon: Betha Loa, MD;  Location: [facility];  Service: Orthopedics;  Laterality: Right;  . NERVE, TENDON AND ARTERY REPAIR Right 05/20/2017   Procedure: NERVE, TENDON AND ARTERY REPAIR RIGHT SMALL FINGER;  Surgeon: Betha Loa, MD;  Location: [facility];  Service: Orthopedics;  Laterality: Right;  . WRIST SURGERY      Family History: History reviewed. No pertinent family history.  Social History:   reports that he has never smoked. He has never used smokeless tobacco. He reports that he drinks alcohol. He reports that he does not use drugs.  Medications: Medications Prior to Admission  Medication Sig Dispense Refill  . guanFACINE (INTUNIV) 2 MG TB24 ER tablet Take 3 mg by mouth daily.     . Lisdexamfetamine Dimesylate (VYVANSE PO) Take 50 mg by mouth 1 day or 1 dose.     . pravastatin (PRAVACHOL) 40 MG tablet Take 40 mg by mouth daily.      No results found for this or any previous visit (from the past 48 hour(s)).  No results found.   A comprehensive review of systems was negative.  Height  (1.88 m), weight 104.8 kg (231 lb).  General appearance: alert, cooperative and appears stated age Head: Normocephalic, without obvious abnormality, atraumatic Neck: supple, symmetrical, trachea midline Cardio: regular  rate and rhythm Resp: clear to auscultation bilaterally Extremities: Intact sensation and capillary refill all digits.  +epl/fpl/io.  No wounds.  Unable to flex right small finger. Pulses: 2+ and symmetric Skin: Skin color, texture, turgor normal. No rashes or lesions Neurologic: Grossly normal Incision/Wound: none  Assessment/Plan Right small finger flexor tendon injury with failure of repair and reconstruction.  Plan is for [facility] rod placement for initiation of two-stage repair.  Risks, benefits, and alternatives of surgery were discussed and the patient agrees with the plan of care.   Ceniyah Thorp R 11/08/2017, 12:05 PM

## 2017-11-08 NOTE — Discharge Instructions (Addendum)
Post Anesthesia Home Care Instructions  Activity: Get plenty of rest for the remainder of the day. A responsible individual must stay with you for 24 hours following the procedure.  For the next 24 hours, DO NOT: -Drive a car -Operate machinery -Drink alcoholic beverages -Take any medication unless instructed by your physician -Make any legal decisions or sign important papers.  Meals: Start with liquid foods such as gelatin or soup. Progress to regular foods as tolerated. Avoid greasy, spicy, heavy foods. If nausea and/or vomiting occur, drink only clear liquids until the nausea and/or vomiting subsides. Call your physician if vomiting continues.  Special Instructions/Symptoms: Your throat may feel dry or sore from the anesthesia or the breathing tube placed in your throat during surgery. If this causes discomfort, gargle with warm salt water. The discomfort should disappear within 24 hours.  If you had a scopolamine patch placed behind your ear for the management of post- operative nausea and/or vomiting:  1. The medication in the patch is effective for 72 hours, after which it should be removed.  Wrap patch in a tissue and discard in the trash. Wash hands thoroughly with soap and water. 2. You may remove the patch earlier than 72 hours if you experience unpleasant side effects which may include dry mouth, dizziness or visual disturbances. 3. Avoid touching the patch. Wash your hands with soap and water after contact with the patch.      Regional Anesthesia Blocks  1. Numbness or the inability to move the "blocked" extremity may last from 3-48 hours after placement. The length of time depends on the medication injected and your individual response to the medication. If the numbness is not going away after 48 hours, call your surgeon.  2. The extremity that is blocked will need to be protected until the numbness is gone and the  Strength has returned. Because you cannot feel it, you  will need to take extra care to avoid injury. Because it may be weak, you may have difficulty moving it or using it. You may not know what position it is in without looking at it while the block is in effect.  3. For blocks in the legs and feet, returning to weight bearing and walking needs to be done carefully. You will need to wait until the numbness is entirely gone and the strength has returned. You should be able to move your leg and foot normally before you try and bear weight or walk. You will need someone to be with you when you first try to ensure you do not fall and possibly risk injury.  4. Bruising and tenderness at the needle site are common side effects and will resolve in a few days.  5. Persistent numbness or new problems with movement should be communicated to the surgeon or the [facility] ([phone])/ [facility] ([phone]).    [facility] Center Instructions Hand Surgery  Wound Care: Keep your hand elevated above the level of your heart.  Do not allow it to dangle by your side.  Keep the dressing dry and do not remove it unless your doctor advises you to do so.  He will usually change it at the time of your post-op visit.  Moving your fingers is advised to stimulate circulation but will depend on the site of your surgery.  If you have a splint applied, your doctor will advise you regarding movement.  Activity: Do not drive or operate machinery today.  Rest today and   then you may return to your normal activity and work as indicated by your physician.  Diet:  Drink liquids today or eat a light diet.  You may resume a regular diet tomorrow.    General expectations: Pain for two to three days. Fingers may become slightly swollen.  Call your doctor if any of the following occur: Severe pain not relieved by pain medication. Elevated temperature. Dressing soaked with blood. Inability to move fingers. White or bluish color to fingers.  

## 2017-11-09 ENCOUNTER — Encounter (HOSPITAL_BASED_OUTPATIENT_CLINIC_OR_DEPARTMENT_OTHER): Payer: Self-pay | Admitting: Orthopedic Surgery

## 2017-12-14 ENCOUNTER — Other Ambulatory Visit: Payer: Self-pay

## 2017-12-14 ENCOUNTER — Encounter (HOSPITAL_BASED_OUTPATIENT_CLINIC_OR_DEPARTMENT_OTHER): Payer: Self-pay | Admitting: *Deleted

## 2017-12-14 ENCOUNTER — Other Ambulatory Visit: Payer: Self-pay | Admitting: Orthopedic Surgery

## 2017-12-15 ENCOUNTER — Ambulatory Visit (HOSPITAL_BASED_OUTPATIENT_CLINIC_OR_DEPARTMENT_OTHER)
Admission: RE | Admit: 2017-12-15 | Discharge: 2017-12-15 | Disposition: A | Discharge status: Home / Self Care | Payer: PRIVATE HEALTH INSURANCE | Source: Ambulatory Visit | Attending: Orthopedic Surgery | Admitting: Orthopedic Surgery

## 2017-12-15 ENCOUNTER — Ambulatory Visit (HOSPITAL_BASED_OUTPATIENT_CLINIC_OR_DEPARTMENT_OTHER): Payer: PRIVATE HEALTH INSURANCE | Admitting: Anesthesiology

## 2017-12-15 ENCOUNTER — Encounter (HOSPITAL_BASED_OUTPATIENT_CLINIC_OR_DEPARTMENT_OTHER): Payer: Self-pay

## 2017-12-15 ENCOUNTER — Other Ambulatory Visit: Payer: Self-pay

## 2017-12-15 ENCOUNTER — Encounter (HOSPITAL_BASED_OUTPATIENT_CLINIC_OR_DEPARTMENT_OTHER)
Admission: RE | Disposition: A | Discharge status: Home / Self Care | Payer: Self-pay | Source: Ambulatory Visit | Attending: Orthopedic Surgery

## 2017-12-15 DIAGNOSIS — Z79899 Other long term (current) drug therapy: Secondary | ICD-10-CM | POA: Insufficient documentation

## 2017-12-15 DIAGNOSIS — M66341 Spontaneous rupture of flexor tendons, right hand: Secondary | ICD-10-CM | POA: Diagnosis present

## 2017-12-15 DIAGNOSIS — F988 Other specified behavioral and emotional disorders with onset usually occurring in childhood and adolescence: Secondary | ICD-10-CM | POA: Diagnosis not present

## 2017-12-15 HISTORY — PX: FLEXOR TENDON REPAIR: SHX6501

## 2017-12-15 SURGERY — REPAIR, TENDON, FLEXOR
Anesthesia: General | Site: Hand | Laterality: Right

## 2017-12-15 MED ORDER — LACTATED RINGERS IV SOLN
INTRAVENOUS | Status: DC
  Administered 2017-12-15 (×3): via INTRAVENOUS

## 2017-12-15 MED ORDER — CHLORHEXIDINE GLUCONATE 4 % EX LIQD: 60.0000 mL | Freq: Once | CUTANEOUS | Status: DC

## 2017-12-15 MED ORDER — PROPOFOL 10 MG/ML IV BOLUS
INTRAVENOUS | Status: AC
  Filled 2017-12-15: qty 20

## 2017-12-15 MED ORDER — HYDROCODONE-ACETAMINOPHEN 5-325 MG PO TABS
ORAL_TABLET | ORAL | Status: AC
  Filled 2017-12-15: qty 1

## 2017-12-15 MED ORDER — HYDROMORPHONE HCL 1 MG/ML IJ SOLN
INTRAMUSCULAR | Status: AC
  Filled 2017-12-15: qty 0.5

## 2017-12-15 MED ORDER — HYDROCODONE-ACETAMINOPHEN 5-325 MG PO TABS: ORAL_TABLET | ORAL | 0 refills | Status: DC

## 2017-12-15 MED ORDER — FENTANYL CITRATE (PF) 100 MCG/2ML IJ SOLN
INTRAMUSCULAR | Status: AC
  Filled 2017-12-15: qty 2

## 2017-12-15 MED ORDER — HYDROCODONE-ACETAMINOPHEN 5-325 MG PO TABS
1.0000 | ORAL_TABLET | Freq: Once | ORAL | Status: AC | PRN
  Administered 2017-12-15: 1 via ORAL

## 2017-12-15 MED ORDER — GLYCOPYRROLATE 0.2 MG/ML IJ SOLN
INTRAMUSCULAR | Status: DC | PRN
  Administered 2017-12-15: 0.2 mg via INTRAVENOUS

## 2017-12-15 MED ORDER — PROPOFOL 10 MG/ML IV BOLUS
INTRAVENOUS | Status: DC | PRN
  Administered 2017-12-15: 300 mg via INTRAVENOUS

## 2017-12-15 MED ORDER — SCOPOLAMINE 1 MG/3DAYS TD PT72: 1.0000 | MEDICATED_PATCH | Freq: Once | TRANSDERMAL | Status: DC | PRN

## 2017-12-15 MED ORDER — ONDANSETRON HCL 4 MG/2ML IJ SOLN
INTRAMUSCULAR | Status: DC | PRN
  Administered 2017-12-15: 4 mg via INTRAVENOUS

## 2017-12-15 MED ORDER — LIDOCAINE HCL (CARDIAC) PF 100 MG/5ML IV SOSY
PREFILLED_SYRINGE | INTRAVENOUS | Status: DC | PRN
  Administered 2017-12-15: 100 mg via INTRAVENOUS

## 2017-12-15 MED ORDER — FENTANYL CITRATE (PF) 100 MCG/2ML IJ SOLN
50.0000 ug | INTRAMUSCULAR | Status: DC | PRN
  Administered 2017-12-15 (×2): 100 ug via INTRAVENOUS

## 2017-12-15 MED ORDER — CEFAZOLIN SODIUM-DEXTROSE 2-4 GM/100ML-% IV SOLN: 2.0000 g | INTRAVENOUS | Status: DC

## 2017-12-15 MED ORDER — CEFAZOLIN SODIUM-DEXTROSE 2-3 GM-%(50ML) IV SOLR
INTRAVENOUS | Status: DC | PRN
  Administered 2017-12-15: 2 g via INTRAVENOUS

## 2017-12-15 MED ORDER — BUPIVACAINE HCL (PF) 0.25 % IJ SOLN
INTRAMUSCULAR | Status: DC | PRN
  Administered 2017-12-15: 10 mL

## 2017-12-15 MED ORDER — CEFAZOLIN SODIUM-DEXTROSE 2-4 GM/100ML-% IV SOLN
INTRAVENOUS | Status: AC
  Filled 2017-12-15: qty 100

## 2017-12-15 MED ORDER — PROMETHAZINE HCL 25 MG/ML IJ SOLN: 6.2500 mg | INTRAMUSCULAR | Status: DC | PRN

## 2017-12-15 MED ORDER — MIDAZOLAM HCL 2 MG/2ML IJ SOLN
1.0000 mg | INTRAMUSCULAR | Status: DC | PRN
  Administered 2017-12-15: 2 mg via INTRAVENOUS

## 2017-12-15 MED ORDER — MIDAZOLAM HCL 2 MG/2ML IJ SOLN
INTRAMUSCULAR | Status: AC
  Filled 2017-12-15: qty 2

## 2017-12-15 MED ORDER — HYDROMORPHONE HCL 1 MG/ML IJ SOLN
0.2500 mg | INTRAMUSCULAR | Status: DC | PRN
  Administered 2017-12-15 (×4): 0.5 mg via INTRAVENOUS

## 2017-12-15 SURGICAL SUPPLY — 82 items
BAG DECANTER FOR FLEXI CONT (MISCELLANEOUS) IMPLANT
BALL CTTN LRG ABS STRL LF (GAUZE/BANDAGES/DRESSINGS)
BANDAGE ACE 3X5.8 VEL STRL LF (GAUZE/BANDAGES/DRESSINGS) ×3 IMPLANT
BLADE MINI RND TIP GREEN BEAV (BLADE) IMPLANT
BLADE SURG 15 STRL LF DISP TIS (BLADE) ×2 IMPLANT
BLADE SURG 15 STRL SS (BLADE) ×6
BNDG CMPR 9X4 STRL LF SNTH (GAUZE/BANDAGES/DRESSINGS) ×1
BNDG CONFORM 2 STRL LF (GAUZE/BANDAGES/DRESSINGS) IMPLANT
BNDG ELASTIC 2X5.8 VLCR STR LF (GAUZE/BANDAGES/DRESSINGS) IMPLANT
BNDG ESMARK 4X9 LF (GAUZE/BANDAGES/DRESSINGS) ×3 IMPLANT
BNDG GAUZE ELAST 4 BULKY (GAUZE/BANDAGES/DRESSINGS) IMPLANT
CATH ROBINSON RED A/P 10FR (CATHETERS) IMPLANT
CHLORAPREP W/TINT 26ML (MISCELLANEOUS) ×3 IMPLANT
CORD BIPOLAR FORCEPS 12FT (ELECTRODE) ×3 IMPLANT
COTTONBALL LRG STERILE PKG (GAUZE/BANDAGES/DRESSINGS) IMPLANT
COVER BACK TABLE 60X90IN (DRAPES) ×3 IMPLANT
COVER MAYO STAND STRL (DRAPES) ×3 IMPLANT
CUFF TOURNIQUET SINGLE 18IN (TOURNIQUET CUFF) ×3 IMPLANT
DECANTER SPIKE VIAL GLASS SM (MISCELLANEOUS) IMPLANT
DRAIN TLS ROUND 10FR (DRAIN) IMPLANT
DRAPE EXTREMITY T 121X128X90 (DRAPE) ×3 IMPLANT
DRAPE OEC MINIVIEW 54X84 (DRAPES) IMPLANT
DRAPE SURG 17X23 STRL (DRAPES) ×3 IMPLANT
DRSG PAD ABDOMINAL 8X10 ST (GAUZE/BANDAGES/DRESSINGS) IMPLANT
GAUZE SPONGE 4X4 12PLY STRL (GAUZE/BANDAGES/DRESSINGS) ×3 IMPLANT
GAUZE SPONGE 4X4 16PLY XRAY LF (GAUZE/BANDAGES/DRESSINGS) IMPLANT
GAUZE XEROFORM 1X8 LF (GAUZE/BANDAGES/DRESSINGS) ×3 IMPLANT
GLOVE BIO SURGEON STRL SZ7.5 (GLOVE) ×3 IMPLANT
GLOVE BIOGEL PI IND STRL 7.0 (GLOVE) IMPLANT
GLOVE BIOGEL PI IND STRL 8 (GLOVE) ×1 IMPLANT
GLOVE BIOGEL PI IND STRL 8.5 (GLOVE) IMPLANT
GLOVE BIOGEL PI INDICATOR 7.0 (GLOVE) ×4
GLOVE BIOGEL PI INDICATOR 8 (GLOVE) ×2
GLOVE BIOGEL PI INDICATOR 8.5 (GLOVE) ×2
GLOVE ECLIPSE 6.5 STRL STRAW (GLOVE) ×2 IMPLANT
GLOVE SURG ORTHO 8.0 STRL STRW (GLOVE) ×3 IMPLANT
GOWN STRL REUS W/ TWL LRG LVL3 (GOWN DISPOSABLE) ×1 IMPLANT
GOWN STRL REUS W/TWL LRG LVL3 (GOWN DISPOSABLE) ×3
GOWN STRL REUS W/TWL XL LVL3 (GOWN DISPOSABLE) ×3 IMPLANT
IMPL HUNTER TEND PASS 4X25 (Spacer) IMPLANT
IMPLANT HUNTER TEND PASS 4X25 (Spacer) ×3 IMPLANT
K-WIRE .035X4 (WIRE) IMPLANT
LOOP VESSEL MAXI BLUE (MISCELLANEOUS) IMPLANT
NDL KEITH (NEEDLE) IMPLANT
NEEDLE HYPO 25X1 1.5 SAFETY (NEEDLE) ×3 IMPLANT
NEEDLE KEITH (NEEDLE) IMPLANT
NS IRRIG 1000ML POUR BTL (IV SOLUTION) ×3 IMPLANT
PACK BASIN DAY SURGERY FS (CUSTOM PROCEDURE TRAY) ×3 IMPLANT
PAD CAST 3X4 CTTN HI CHSV (CAST SUPPLIES) ×1 IMPLANT
PAD CAST 4YDX4 CTTN HI CHSV (CAST SUPPLIES) IMPLANT
PADDING CAST ABS 3INX4YD NS (CAST SUPPLIES)
PADDING CAST ABS 4INX4YD NS (CAST SUPPLIES) ×2
PADDING CAST ABS COTTON 3X4 (CAST SUPPLIES) IMPLANT
PADDING CAST ABS COTTON 4X4 ST (CAST SUPPLIES) ×1 IMPLANT
PADDING CAST COTTON 3X4 STRL (CAST SUPPLIES) ×3
PADDING CAST COTTON 4X4 STRL (CAST SUPPLIES)
SLEEVE SCD COMPRESS KNEE MED (MISCELLANEOUS) ×3 IMPLANT
SPLINT PLASTER CAST XFAST 3X15 (CAST SUPPLIES) IMPLANT
SPLINT PLASTER CAST XFAST 4X15 (CAST SUPPLIES) IMPLANT
SPLINT PLASTER XTRA FAST SET 4 (CAST SUPPLIES)
SPLINT PLASTER XTRA FASTSET 3X (CAST SUPPLIES)
STOCKINETTE 4X48 STRL (DRAPES) ×3 IMPLANT
SUT CHROMIC 5 0 P 3 (SUTURE) IMPLANT
SUT ETHIBOND 3-0 V-5 (SUTURE) IMPLANT
SUT ETHILON 3 0 PS 1 (SUTURE) IMPLANT
SUT ETHILON 4 0 PS 2 18 (SUTURE) IMPLANT
SUT FIBERWIRE 4-0 18 DIAM BLUE (SUTURE)
SUT MERSILENE 2.0 SH NDLE (SUTURE) IMPLANT
SUT MERSILENE 4 0 P 3 (SUTURE) IMPLANT
SUT PROLENE 2 0 SH DA (SUTURE) IMPLANT
SUT PROLENE 5 0 P 3 (SUTURE) IMPLANT
SUT SILK 4 0 PS 2 (SUTURE) ×2 IMPLANT
SUT SUPRAMID 4-0 (SUTURE) IMPLANT
SUT VIC AB 4-0 P-3 18XBRD (SUTURE) IMPLANT
SUT VIC AB 4-0 P3 18 (SUTURE)
SUT VICRYL 4-0 PS2 18IN ABS (SUTURE) IMPLANT
SUTURE FIBERWR 4-0 18 DIA BLUE (SUTURE) IMPLANT
SYR BULB 3OZ (MISCELLANEOUS) ×3 IMPLANT
SYR CONTROL 10ML LL (SYRINGE) ×3 IMPLANT
TOWEL GREEN STERILE FF (TOWEL DISPOSABLE) ×6 IMPLANT
TUBE FEEDING ENTERAL 5FR 16IN (TUBING) IMPLANT
UNDERPAD 30X30 (UNDERPADS AND DIAPERS) ×3 IMPLANT

## 2017-12-15 NOTE — Discharge Instructions (Addendum)

## 2017-12-15 NOTE — Anesthesia Postprocedure Evaluation (Signed)
Anesthesia Post Note  Patient: Cathlean SauerJeffrey L Laramie  Procedure(s) Performed: RIGHT SMALL FINGER FLEXOR TENDON RECONSTRUCTION HUNTER OF PLACEMENT (Right Hand)     Patient location during evaluation: PACU Anesthesia Type: General Level of consciousness: sedated Pain management: pain level controlled Vital Signs Assessment: post-procedure vital signs reviewed and stable Respiratory status: spontaneous breathing and respiratory function stable Cardiovascular status: stable Postop Assessment: no apparent nausea or vomiting Anesthetic complications: no    Last Vitals:  Vitals:   12/15/17 1645 12/15/17 1700  BP: (!) 131/93 (!) 138/92  Pulse: 75 71  Resp: 17 14  Temp:    SpO2: 95% 95%    Last Pain:  Vitals:   12/15/17 1700  TempSrc:   PainSc: 5                  Charlene Cowdrey DANIEL

## 2017-12-15 NOTE — Transfer of Care (Signed)
Immediate Anesthesia Transfer of Care Note  Patient: Edward Chapman  Procedure(s) Performed: RIGHT SMALL FINGER FLEXOR TENDON RECONSTRUCTION HUNTER OF PLACEMENT (Right Hand)  Patient Location: PACU  Anesthesia Type:General  Level of Consciousness: awake, alert  and oriented  Airway & Oxygen Therapy: Patient Spontanous Breathing and Patient connected to face mask oxygen  Post-op Assessment: Report given to RN and Post -op Vital signs reviewed and stable  Post vital signs: Reviewed and stable  Last Vitals:  Vitals Value Taken Time  BP 125/76 12/15/2017  2:47 PM  Temp    Pulse 84 12/15/2017  2:51 PM  Resp 12 12/15/2017  2:51 PM  SpO2 98 % 12/15/2017  2:51 PM  Vitals shown include unvalidated device data.  Last Pain:  Vitals:   12/15/17 1211  TempSrc:   PainSc: 0-No pain         Complications: No apparent anesthesia complications

## 2017-12-15 NOTE — Anesthesia Preprocedure Evaluation (Addendum)
Anesthesia Evaluation  Patient identified by MRN, date of birth, ID band Patient awake    Reviewed: Allergy & Precautions, NPO status , Patient's Chart, lab work & pertinent test results  History of Anesthesia Complications Negative for: history of anesthetic complications  Airway Mallampati: II  TM Distance: >3 FB Neck ROM: Full    Dental  (+) Teeth Intact, Dental Advisory Given   Pulmonary neg pulmonary ROS,    Pulmonary exam normal        Cardiovascular negative cardio ROS Normal cardiovascular exam     Neuro/Psych negative neurological ROS  negative psych ROS   GI/Hepatic negative GI ROS, Neg liver ROS,   Endo/Other  negative endocrine ROS  Renal/GU negative Renal ROS  negative genitourinary   Musculoskeletal negative musculoskeletal ROS (+)   Abdominal   Peds negative pediatric ROS (+)  Hematology negative hematology ROS (+)   Anesthesia Other Findings   Reproductive/Obstetrics negative OB ROS                            Anesthesia Physical Anesthesia Plan  ASA: I  Anesthesia Plan: General   Post-op Pain Management:    Induction: Intravenous  PONV Risk Score and Plan: 2 and Ondansetron and Dexamethasone  Airway Management Planned: LMA  Additional Equipment:   Intra-op Plan:   Post-operative Plan: Extubation in OR  Informed Consent: I have reviewed the patients History and Physical, chart, labs and discussed the procedure including the risks, benefits and alternatives for the proposed anesthesia with the patient or authorized representative who has indicated his/her understanding and acceptance.   Dental advisory given  Plan Discussed with: CRNA and Anesthesiologist  Anesthesia Plan Comments:        Anesthesia Quick Evaluation

## 2017-12-15 NOTE — Op Note (Signed)
I assisted Surgeon(s) and Role:    * Betha LoaKuzma, Kevin, MD - Primary    * Cindee SaltKuzma, Yerachmiel Spinney, MD - Assisting on the Procedure(s): RIGHT SMALL FINGER FLEXOR TENDON RECONSTRUCTION HUNTER OF PLACEMENT on 12/15/2017.  I provided assistance on this case as follows: setup. Exploration removal of ruptured tendon, placement of silastic rod, closure of the incisions and application of the dressings and splint.  Electronically signed by: Nicki ReaperKUZMA,Ronal Maybury R, MD Date: 12/15/2017 Time: 2:51 PM

## 2017-12-15 NOTE — Op Note (Signed)
NAME: Cathlean SauerJeffrey L Coddington MEDICAL RECORD NO: [id number] DATE OF BIRTH: 06/05/1961 [facility] PHYSICIAN: Tami RibasKEVIN R. Shabria Egley, MD   OPERATIVE REPORT   DATE OF PROCEDURE: 12/15/17    PREOPERATIVE DIAGNOSIS:   Right small finger flexor tendon reconstruction rupture   POSTOPERATIVE DIAGNOSIS:   Right small finger flexor tendon reconstruction rupture   PROCEDURE:   Right small finger excision of flexor tendon reconstruction rupture with placement of Hunter rod   SURGEON:  Betha LoaKevin Joycelin Radloff, M.D.   ASSISTANT: none Cindee SaltGary Kaela Beitz, MD   ANESTHESIA:  General   INTRAVENOUS FLUIDS:  Per anesthesia flow sheet.   ESTIMATED BLOOD LOSS:  Minimal.   COMPLICATIONS:  None.   SPECIMENS:   Cultures to micro   TOURNIQUET TIME:    Total Tourniquet Time Documented: Upper Arm (Right) - 78 minutes Total: Upper Arm (Right) - 78 minutes    DISPOSITION:  Stable to PACU.   INDICATIONS: 57 year old male who is undergone right small finger flexor tendon repair followed by reconstruction with lysis of adhesions.  He felt a pop and had inability to flex his finger postsurgically.  He returns today for exploration with flexor tendon reconstruction versus staged reconstruction. Risks, benefits and alternatives of surgery were discussed including the risks of blood loss, infection, damage to nerves, vessels, tendons, ligaments, bone for surgery, need for additional surgery, complications with wound healing, continued pain, nonunion, malunion, stiffness.  He voiced understanding of these risks and elected to proceed.  OPERATIVE COURSE:  After being identified preoperatively by myself,  the patient and I agreed on the procedure and site of the procedure.  The surgical site was marked.  Surgical consent had been signed. He was given IV Ancef as preoperative antibiotic prophylaxis. He was transferred to the operating room and placed on the operating table in supine position with the Right  upper extremity on an arm board.  General anesthesia was induced by the anesthesiologist.  Right upper extremity was prepped and draped in normal sterile orthopedic fashion.  A surgical pause was performed between the surgeons, anesthesia, and operating room staff and all were in agreement as to the patient, procedure, and site of procedure.  Tourniquet at the proximal aspect of the extremity was inflated to 250 mmHg after exsanguination of the arm with an Esmarch bandage.    Previous incision was followed.  This is done in the palm first.  This carried in subtenons tissues by spreading technique.  The radial neurovascular bundle was identified and protected throughout the case.  The ulnar neurovascular bundle was left in its fatty covering and protected.  The Pulvertaft weave was identified.  The tendon had ruptured distal to this.  This was a rupture through the grafted portion of the tendon.  The sheath was collapsed proximally.  It was able to be opened with gentle dilation.  Incision was made at the distal phalanx.  The tendon graft had healed to the distal phalanx nicely.  This graft was freed up and removed with the exception of the distalmost portion.  It was felt that single-stage reconstruction was not a good option due to the collapse of the sheath.  A 4 mm Hunter rod was selected.  The wounds were copiously irrigated with sterile saline.  The Hunter rod was then placed through the sheath.  Was able to be passed through the carpal tunnel and into the distal forearm.  C-arm was used to ensure appropriate placement.  The finger was placed into flexion  to ensure that the [facility] rod did not bunched.  The rod was secured to the distal phalanx soft tissues using a Ethibond suture.  The wounds were again irrigated with sterile saline and closed with 4-0 nylon in a horizontal mattress fashion.  Cultures had been taken from around the Pulvertaft and ruptured graft site and sent to microbiology for examination.   The Pulvertaft itself was excised prior to placement of the Hunter rod.  The wounds were injected with quarter percent plain Marcaine to aid in postoperative analgesia.  The tourniquet was deflated at 78 minutes.  Fingertips were pink with brisk capillary refill after deflation of tourniquet.  The operative  drapes were broken down.  The patient was awoken from anesthesia safely.  He was transferred back to the stretcher and taken to PACU in stable condition.  I will see him back in the office in 1 week for postoperative followup.  I will give him a prescription for Norco 5/325 1-2 tabs PO q6 hours prn pain, dispense # 30.   Tami Ribas, MD Electronically signed, 12/15/17

## 2017-12-15 NOTE — H&P (Signed)
  Edward Chapman is an 57 y.o. male.   Chief Complaint: right small finger flexor tendon rupture HPI: 57 yo male s/p right small finger flexor tendon repair, reconstruction, lysis of adhesions with rupture of tendon reconstruction.  He wishes to proceed with flexor tendon reconstruction possible Hunter rod placement.  Allergies: No Known Allergies  Past Medical History:  Diagnosis Date  . ADD (attention deficit disorder)     Past Surgical History:  Procedure Laterality Date  . FLEXOR TENDON REPAIR Right 06/09/2017   Procedure: RIGHT FLEXOR TENDON REPAIR RECONSTRUCTION;  Surgeon: Edward Chapman, Edward Hostetler, MD;  Location: [facility];  Service: Orthopedics;  Laterality: Right;  . NERVE, TENDON AND ARTERY REPAIR Right 05/20/2017   Procedure: NERVE, TENDON AND ARTERY REPAIR RIGHT SMALL FINGER;  Surgeon: Edward Chapman, Edward Farruggia, MD;  Location: [facility];  Service: Orthopedics;  Laterality: Right;  . TENOLYSIS Right 11/08/2017   Procedure: RIGHT SMALL FLEXOR TENOLYSIS;  Surgeon: Edward Chapman, Edward Strike, MD;  Location: [facility];  Service: Orthopedics;  Laterality: Right;  . WRIST SURGERY      Family History: History reviewed. No pertinent family history.  Social History:   reports that he has never smoked. He has never used smokeless tobacco. He reports that he drinks alcohol. He reports that he does not use drugs.  Medications: Medications Prior to Admission  Medication Sig Dispense Refill  . guanFACINE (INTUNIV) 2 MG TB24 ER tablet Take 3 mg by mouth daily.     Marland Kitchen. ibuprofen (ADVIL,MOTRIN) 200 MG tablet Take 600 mg by mouth every 6 (six) hours as needed.    . Lisdexamfetamine Dimesylate (VYVANSE PO) Take 50 mg by mouth 1 day or 1 dose.     . pravastatin (PRAVACHOL) 40 MG tablet Take 40 mg by mouth daily.      No results found for this or any previous visit (from the past 48 hour(s)).  No results found.   A comprehensive review of systems was negative.  Height 6\' 2"   (1.88 m), weight 108 kg (238 lb).  General appearance: alert, cooperative and appears stated age Head: Normocephalic, without obvious abnormality, atraumatic Neck: supple, symmetrical, trachea midline Cardio: regular rate and rhythm Resp: clear to auscultation bilaterally Extremities: Intact sensation and capillary refill all digits.  +epl/fpl/io.  No wounds.  Pulses: 2+ and symmetric Skin: Skin color, texture, turgor normal. No rashes or lesions Neurologic: Grossly normal Incision/Wound: none  Assessment/Plan Right small finger flexor tendon rupture.  Non operative and operative treatment options were discussed with the patient and patient wishes to proceed with operative treatment. Risks, benefits, and alternatives of surgery were discussed and the patient agrees with the plan of care.   Edward Chapman R 12/15/2017, 11:39 AM

## 2017-12-16 ENCOUNTER — Encounter (HOSPITAL_BASED_OUTPATIENT_CLINIC_OR_DEPARTMENT_OTHER): Payer: Self-pay | Admitting: Orthopedic Surgery

## 2017-12-20 LAB — ACID FAST SMEAR (AFB, MYCOBACTERIA): Acid Fast Smear: NEGATIVE

## 2017-12-20 LAB — AEROBIC/ANAEROBIC CULTURE W GRAM STAIN (SURGICAL/DEEP WOUND): Culture: NO GROWTH

## 2018-01-12 LAB — FUNGUS CULTURE WITH STAIN

## 2018-01-12 LAB — FUNGAL ORGANISM REFLEX

## 2018-01-12 LAB — FUNGUS CULTURE RESULT

## 2018-01-31 LAB — ACID FAST CULTURE WITH REFLEXED SENSITIVITIES

## 2018-01-31 LAB — ACID FAST CULTURE WITH REFLEXED SENSITIVITIES (MYCOBACTERIA): Acid Fast Culture: NEGATIVE

## 2018-03-07 ENCOUNTER — Other Ambulatory Visit: Payer: Self-pay | Admitting: Orthopedic Surgery

## 2018-03-24 ENCOUNTER — Other Ambulatory Visit: Payer: Self-pay

## 2018-03-24 ENCOUNTER — Encounter (HOSPITAL_BASED_OUTPATIENT_CLINIC_OR_DEPARTMENT_OTHER): Payer: Self-pay | Admitting: *Deleted

## 2018-03-30 ENCOUNTER — Encounter (HOSPITAL_BASED_OUTPATIENT_CLINIC_OR_DEPARTMENT_OTHER)
Admission: RE | Disposition: A | Discharge status: Home / Self Care | Payer: Self-pay | Source: Ambulatory Visit | Attending: Orthopedic Surgery

## 2018-03-30 ENCOUNTER — Ambulatory Visit (HOSPITAL_BASED_OUTPATIENT_CLINIC_OR_DEPARTMENT_OTHER)
Admission: RE | Admit: 2018-03-30 | Discharge: 2018-03-30 | Disposition: A | Discharge status: Home / Self Care | Payer: PRIVATE HEALTH INSURANCE | Source: Ambulatory Visit | Attending: Orthopedic Surgery | Admitting: Orthopedic Surgery

## 2018-03-30 ENCOUNTER — Encounter (HOSPITAL_BASED_OUTPATIENT_CLINIC_OR_DEPARTMENT_OTHER): Payer: Self-pay | Admitting: *Deleted

## 2018-03-30 ENCOUNTER — Ambulatory Visit (HOSPITAL_BASED_OUTPATIENT_CLINIC_OR_DEPARTMENT_OTHER): Payer: PRIVATE HEALTH INSURANCE | Admitting: Anesthesiology

## 2018-03-30 DIAGNOSIS — X58XXXA Exposure to other specified factors, initial encounter: Secondary | ICD-10-CM | POA: Insufficient documentation

## 2018-03-30 DIAGNOSIS — Z6830 Body mass index (BMI) 30.0-30.9, adult: Secondary | ICD-10-CM | POA: Insufficient documentation

## 2018-03-30 DIAGNOSIS — S61216A Laceration without foreign body of right little finger without damage to nail, initial encounter: Secondary | ICD-10-CM | POA: Insufficient documentation

## 2018-03-30 DIAGNOSIS — F988 Other specified behavioral and emotional disorders with onset usually occurring in childhood and adolescence: Secondary | ICD-10-CM | POA: Diagnosis not present

## 2018-03-30 DIAGNOSIS — Y939 Activity, unspecified: Secondary | ICD-10-CM | POA: Diagnosis not present

## 2018-03-30 DIAGNOSIS — E669 Obesity, unspecified: Secondary | ICD-10-CM | POA: Insufficient documentation

## 2018-03-30 DIAGNOSIS — Z79899 Other long term (current) drug therapy: Secondary | ICD-10-CM | POA: Diagnosis not present

## 2018-03-30 HISTORY — PX: TENDON TRANSFER: SHX6109

## 2018-03-30 HISTORY — PX: FLEXOR TENDON REPAIR: SHX6501

## 2018-03-30 HISTORY — DX: Strain of other specified muscles, fascia and tendons at wrist and hand level, unspecified hand, initial encounter: S66.819A

## 2018-03-30 SURGERY — REPAIR, TENDON, FLEXOR
Anesthesia: General | Site: Hand | Laterality: Left

## 2018-03-30 MED ORDER — CEFAZOLIN SODIUM-DEXTROSE 2-4 GM/100ML-% IV SOLN
INTRAVENOUS | Status: AC
  Filled 2018-03-30: qty 100

## 2018-03-30 MED ORDER — FENTANYL CITRATE (PF) 100 MCG/2ML IJ SOLN
INTRAMUSCULAR | Status: DC | PRN
  Administered 2018-03-30 (×2): 25 ug via INTRAVENOUS

## 2018-03-30 MED ORDER — CHLORHEXIDINE GLUCONATE 4 % EX LIQD: 60.0000 mL | Freq: Once | CUTANEOUS | Status: DC

## 2018-03-30 MED ORDER — CEFAZOLIN SODIUM-DEXTROSE 2-4 GM/100ML-% IV SOLN
2.0000 g | INTRAVENOUS | Status: AC
  Administered 2018-03-30: 2 g via INTRAVENOUS

## 2018-03-30 MED ORDER — LIDOCAINE HCL (CARDIAC) PF 100 MG/5ML IV SOSY
PREFILLED_SYRINGE | INTRAVENOUS | Status: DC | PRN
  Administered 2018-03-30: 100 mg via INTRAVENOUS

## 2018-03-30 MED ORDER — OXYCODONE HCL 5 MG PO TABS: 5.0000 mg | ORAL_TABLET | Freq: Once | ORAL | Status: DC | PRN

## 2018-03-30 MED ORDER — PHENYLEPHRINE HCL 10 MG/ML IJ SOLN
INTRAMUSCULAR | Status: DC | PRN
  Administered 2018-03-30 (×5): 80 ug via INTRAVENOUS

## 2018-03-30 MED ORDER — FENTANYL CITRATE (PF) 100 MCG/2ML IJ SOLN
INTRAMUSCULAR | Status: AC
  Filled 2018-03-30: qty 2

## 2018-03-30 MED ORDER — BUPIVACAINE HCL (PF) 0.25 % IJ SOLN
INTRAMUSCULAR | Status: DC | PRN
  Administered 2018-03-30: 10 mL

## 2018-03-30 MED ORDER — FENTANYL CITRATE (PF) 100 MCG/2ML IJ SOLN
50.0000 ug | INTRAMUSCULAR | Status: DC | PRN
  Administered 2018-03-30: 50 ug via INTRAVENOUS

## 2018-03-30 MED ORDER — FENTANYL CITRATE (PF) 100 MCG/2ML IJ SOLN: 25.0000 ug | INTRAMUSCULAR | Status: DC | PRN

## 2018-03-30 MED ORDER — OXYCODONE HCL 5 MG/5ML PO SOLN: 5.0000 mg | Freq: Once | ORAL | Status: DC | PRN

## 2018-03-30 MED ORDER — LACTATED RINGERS IV SOLN
INTRAVENOUS | Status: DC | PRN
  Administered 2018-03-30 (×2): via INTRAVENOUS

## 2018-03-30 MED ORDER — DEXAMETHASONE SODIUM PHOSPHATE 10 MG/ML IJ SOLN
INTRAMUSCULAR | Status: DC | PRN
  Administered 2018-03-30: 10 mg via INTRAVENOUS

## 2018-03-30 MED ORDER — ROPIVACAINE HCL 7.5 MG/ML IJ SOLN
INTRAMUSCULAR | Status: DC | PRN
  Administered 2018-03-30: 30 mL via PERINEURAL

## 2018-03-30 MED ORDER — EPHEDRINE SULFATE 50 MG/ML IJ SOLN
INTRAMUSCULAR | Status: DC | PRN
  Administered 2018-03-30 (×2): 10 mg via INTRAVENOUS

## 2018-03-30 MED ORDER — PROPOFOL 10 MG/ML IV BOLUS
INTRAVENOUS | Status: DC | PRN
  Administered 2018-03-30: 200 mg via INTRAVENOUS

## 2018-03-30 MED ORDER — ONDANSETRON HCL 4 MG/2ML IJ SOLN
INTRAMUSCULAR | Status: DC | PRN
  Administered 2018-03-30: 4 mg via INTRAVENOUS

## 2018-03-30 MED ORDER — LACTATED RINGERS IV SOLN
INTRAVENOUS | Status: DC
  Administered 2018-03-30: 11:00:00 via INTRAVENOUS

## 2018-03-30 MED ORDER — SCOPOLAMINE 1 MG/3DAYS TD PT72: 1.0000 | MEDICATED_PATCH | Freq: Once | TRANSDERMAL | Status: DC | PRN

## 2018-03-30 MED ORDER — MIDAZOLAM HCL 2 MG/2ML IJ SOLN
INTRAMUSCULAR | Status: AC
  Filled 2018-03-30: qty 2

## 2018-03-30 MED ORDER — MIDAZOLAM HCL 2 MG/2ML IJ SOLN
1.0000 mg | INTRAMUSCULAR | Status: DC | PRN
  Administered 2018-03-30: 2 mg via INTRAVENOUS

## 2018-03-30 MED ORDER — HYDROCODONE-ACETAMINOPHEN 5-325 MG PO TABS: ORAL_TABLET | ORAL | 0 refills | Status: AC

## 2018-03-30 MED ORDER — MEPERIDINE HCL 25 MG/ML IJ SOLN: 6.2500 mg | INTRAMUSCULAR | Status: DC | PRN

## 2018-03-30 MED ORDER — PROMETHAZINE HCL 25 MG/ML IJ SOLN: 6.2500 mg | INTRAMUSCULAR | Status: DC | PRN

## 2018-03-30 SURGICAL SUPPLY — 85 items
BAG DECANTER FOR FLEXI CONT (MISCELLANEOUS) IMPLANT
BALL CTTN LRG ABS STRL LF (GAUZE/BANDAGES/DRESSINGS)
BANDAGE ACE 3X5.8 VEL STRL LF (GAUZE/BANDAGES/DRESSINGS) ×4 IMPLANT
BANDAGE COBAN STERILE 2 (GAUZE/BANDAGES/DRESSINGS) ×2 IMPLANT
BLADE MINI RND TIP GREEN BEAV (BLADE) IMPLANT
BLADE SURG 15 STRL LF DISP TIS (BLADE) ×4 IMPLANT
BLADE SURG 15 STRL SS (BLADE) ×8
BNDG CMPR 9X4 STRL LF SNTH (GAUZE/BANDAGES/DRESSINGS) ×2
BNDG CONFORM 2 STRL LF (GAUZE/BANDAGES/DRESSINGS) IMPLANT
BNDG ELASTIC 2X5.8 VLCR STR LF (GAUZE/BANDAGES/DRESSINGS) IMPLANT
BNDG ESMARK 4X9 LF (GAUZE/BANDAGES/DRESSINGS) ×4 IMPLANT
BNDG GAUZE ELAST 4 BULKY (GAUZE/BANDAGES/DRESSINGS) IMPLANT
CATH ROBINSON RED A/P 10FR (CATHETERS) IMPLANT
CHLORAPREP W/TINT 26ML (MISCELLANEOUS) ×6 IMPLANT
CORD BIPOLAR FORCEPS 12FT (ELECTRODE) ×4 IMPLANT
COTTONBALL LRG STERILE PKG (GAUZE/BANDAGES/DRESSINGS) IMPLANT
COVER BACK TABLE 60X90IN (DRAPES) ×4 IMPLANT
COVER MAYO STAND STRL (DRAPES) ×7 IMPLANT
CUFF TOURNIQUET SINGLE 18IN (TOURNIQUET CUFF) ×6 IMPLANT
DECANTER SPIKE VIAL GLASS SM (MISCELLANEOUS) IMPLANT
DRAIN TLS ROUND 10FR (DRAIN) IMPLANT
DRAPE EXTREMITY T 121X128X90 (DRAPE) ×8 IMPLANT
DRAPE OEC MINIVIEW 54X84 (DRAPES) ×3 IMPLANT
DRAPE SURG 17X23 STRL (DRAPES) ×6 IMPLANT
DRSG PAD ABDOMINAL 8X10 ST (GAUZE/BANDAGES/DRESSINGS) IMPLANT
GAUZE 4X4 16PLY RFD (DISPOSABLE) IMPLANT
GAUZE SPONGE 4X4 12PLY STRL (GAUZE/BANDAGES/DRESSINGS) ×4 IMPLANT
GAUZE XEROFORM 1X8 LF (GAUZE/BANDAGES/DRESSINGS) ×4 IMPLANT
GLOVE BIO SURGEON STRL SZ 6.5 (GLOVE) ×2 IMPLANT
GLOVE BIO SURGEON STRL SZ7.5 (GLOVE) ×4 IMPLANT
GLOVE BIO SURGEONS STRL SZ 6.5 (GLOVE) ×1
GLOVE BIOGEL PI IND STRL 7.0 (GLOVE) ×1 IMPLANT
GLOVE BIOGEL PI IND STRL 8 (GLOVE) ×3 IMPLANT
GLOVE BIOGEL PI IND STRL 8.5 (GLOVE) IMPLANT
GLOVE BIOGEL PI INDICATOR 7.0 (GLOVE) ×2
GLOVE BIOGEL PI INDICATOR 8 (GLOVE) ×4
GLOVE BIOGEL PI INDICATOR 8.5 (GLOVE) ×2
GLOVE SURG ORTHO 8.0 STRL STRW (GLOVE) ×2 IMPLANT
GOWN STRL REUS W/ TWL LRG LVL3 (GOWN DISPOSABLE) ×2 IMPLANT
GOWN STRL REUS W/TWL LRG LVL3 (GOWN DISPOSABLE) ×12
GOWN STRL REUS W/TWL XL LVL3 (GOWN DISPOSABLE) ×4 IMPLANT
K-WIRE .035X4 (WIRE) IMPLANT
LOOP VESSEL MAXI BLUE (MISCELLANEOUS) ×2 IMPLANT
NDL HYPO 25X1 1.5 SAFETY (NEEDLE) IMPLANT
NDL KEITH (NEEDLE) IMPLANT
NEEDLE HYPO 25X1 1.5 SAFETY (NEEDLE) ×4 IMPLANT
NEEDLE KEITH (NEEDLE) IMPLANT
NS IRRIG 1000ML POUR BTL (IV SOLUTION) ×4 IMPLANT
PACK BASIN DAY SURGERY FS (CUSTOM PROCEDURE TRAY) ×4 IMPLANT
PAD CAST 3X4 CTTN HI CHSV (CAST SUPPLIES) ×2 IMPLANT
PAD CAST 4YDX4 CTTN HI CHSV (CAST SUPPLIES) IMPLANT
PADDING CAST ABS 3INX4YD NS (CAST SUPPLIES)
PADDING CAST ABS 4INX4YD NS (CAST SUPPLIES) ×2
PADDING CAST ABS COTTON 3X4 (CAST SUPPLIES) IMPLANT
PADDING CAST ABS COTTON 4X4 ST (CAST SUPPLIES) ×2 IMPLANT
PADDING CAST COTTON 3X4 STRL (CAST SUPPLIES) ×4
PADDING CAST COTTON 4X4 STRL (CAST SUPPLIES)
SLEEVE SCD COMPRESS KNEE MED (MISCELLANEOUS) ×2 IMPLANT
SLING ARM FOAM STRAP LRG (SOFTGOODS) ×3 IMPLANT
SPLINT PLASTER CAST XFAST 3X15 (CAST SUPPLIES) IMPLANT
SPLINT PLASTER CAST XFAST 4X15 (CAST SUPPLIES) IMPLANT
SPLINT PLASTER XTRA FAST SET 4 (CAST SUPPLIES)
SPLINT PLASTER XTRA FASTSET 3X (CAST SUPPLIES)
STOCKINETTE 4X48 STRL (DRAPES) ×8 IMPLANT
SUT CHROMIC 5 0 P 3 (SUTURE) IMPLANT
SUT ETHIBOND 3-0 V-5 (SUTURE) IMPLANT
SUT ETHILON 3 0 PS 1 (SUTURE) IMPLANT
SUT ETHILON 4 0 PS 2 18 (SUTURE) ×12 IMPLANT
SUT FIBERWIRE 4-0 18 DIAM BLUE (SUTURE) ×4
SUT MERSILENE 2.0 SH NDLE (SUTURE) IMPLANT
SUT MERSILENE 4 0 P 3 (SUTURE) IMPLANT
SUT POLY BUTTON 15MM (SUTURE) ×6 IMPLANT
SUT PROLENE 2 0 SH DA (SUTURE) ×3 IMPLANT
SUT PROLENE 5 0 P 3 (SUTURE) IMPLANT
SUT SILK 4 0 PS 2 (SUTURE) IMPLANT
SUT SUPRAMID 4-0 (SUTURE) IMPLANT
SUT VIC AB 4-0 P-3 18XBRD (SUTURE) IMPLANT
SUT VIC AB 4-0 P3 18 (SUTURE)
SUT VICRYL 4-0 PS2 18IN ABS (SUTURE) IMPLANT
SUTURE FIBERWR 4-0 18 DIA BLUE (SUTURE) IMPLANT
SYR BULB 3OZ (MISCELLANEOUS) ×4 IMPLANT
SYR CONTROL 10ML LL (SYRINGE) ×2 IMPLANT
TOWEL GREEN STERILE FF (TOWEL DISPOSABLE) ×8 IMPLANT
TUBE FEEDING ENTERAL 5FR 16IN (TUBING) ×3 IMPLANT
UNDERPAD 30X30 (UNDERPADS AND DIAPERS) ×4 IMPLANT

## 2018-03-30 NOTE — H&P (Signed)
  Edward Chapman is an 57 y.o. male.   Chief Complaint: right small finger flexor tendon injury HPI: 57 yo male s/p stage 1 reconstruction right small finger flexor tendon.  He presents for second stage flexor tendon reconstruction.  Allergies: No Known Allergies  Past Medical History:  Diagnosis Date  . ADD (attention deficit disorder)   . Flexor tendon rupture of hand    right hand    Past Surgical History:  Procedure Laterality Date  . FLEXOR TENDON REPAIR Right 06/09/2017   Procedure: RIGHT FLEXOR TENDON REPAIR RECONSTRUCTION;  Surgeon: Betha Loa, MD;  Location: [facility];  Service: Orthopedics;  Laterality: Right;  . FLEXOR TENDON REPAIR Right 12/15/2017   Procedure: RIGHT SMALL FINGER FLEXOR TENDON RECONSTRUCTION HUNTER OF PLACEMENT;  Surgeon: Betha Loa, MD;  Location: [facility];  Service: Orthopedics;  Laterality: Right;  . NERVE, TENDON AND ARTERY REPAIR Right 05/20/2017   Procedure: NERVE, TENDON AND ARTERY REPAIR RIGHT SMALL FINGER;  Surgeon: Betha Loa, MD;  Location: [facility];  Service: Orthopedics;  Laterality: Right;  . TENOLYSIS Right 11/08/2017   Procedure: RIGHT SMALL FLEXOR TENOLYSIS;  Surgeon: Betha Loa, MD;  Location: [facility];  Service: Orthopedics;  Laterality: Right;  . WRIST SURGERY      Family History: History reviewed. No pertinent family history.  Social History:   reports that he has never smoked. He has never used smokeless tobacco. He reports that he drinks alcohol. He reports that he does not use drugs.  Medications: Medications Prior to Admission  Medication Sig Dispense Refill  . guanFACINE (INTUNIV) 2 MG TB24 ER tablet Take 3 mg by mouth daily.     . Lisdexamfetamine Dimesylate (VYVANSE PO) Take 50 mg by mouth 1 day or 1 dose.     . pravastatin (PRAVACHOL) 40 MG tablet Take 40 mg by mouth daily.    . celecoxib (CELEBREX) 200 MG capsule Take 200 mg by mouth daily. 200mg  x2  daily once per pt      No results found for this or any previous visit (from the past 48 hour(s)).  No results found.   A comprehensive review of systems was negative.  Height 6\' 2"  (1.88 m), weight 111.1 kg.  General appearance: alert, cooperative and appears stated age Head: Normocephalic, without obvious abnormality, atraumatic Neck: supple, symmetrical, trachea midline Cardio: regular rate and rhythm Resp: clear to auscultation bilaterally Extremities: Intact sensation and capillary refill all digits.  +epl/fpl/io.  No wounds.  Pulses: 2+ and symmetric Skin: Skin color, texture, turgor normal. No rashes or lesions Neurologic: Grossly normal Incision/Wound: none  Assessment/Plan Right small finger flexor tendon laceration.  Present for second stage reconstruction.  Non operative and operative treatment options were discussed with the patient and patient wishes to proceed with operative treatment. Risks, benefits, and alternatives of surgery were discussed and the patient agrees with the plan of care.   Betha Loa 03/30/2018, 8:38 AM

## 2018-03-30 NOTE — Op Note (Signed)
I assisted Surgeon(s) and Role:    * Betha Loa, MD - Primary    * Cindee Salt, MD - Assisting on the Procedure(s): RIGHT SMALL FINGER STAGE 2 FLEXOR TENDON RECONSTRUCTION WITH TENDON GRAFT TENDON TRANSFER on 03/30/2018.  I provided assistance on this case as follows: setup, approach, identification of the rod, harvest of the graft, placement of the graft, closure of the wounds and application of the dressing and splint.  Electronically signed by: Cindee Salt, MD Date: 03/30/2018 Time: 1:46 PM

## 2018-03-30 NOTE — Anesthesia Postprocedure Evaluation (Signed)
Anesthesia Post Note  Patient: [name]  Procedure(s) Performed: RIGHT SMALL FINGER STAGE 2 FLEXOR TENDON RECONSTRUCTION WITH TENDON GRAFT (Left Hand) TENDON TRANSFER (Left Arm Lower)     Patient location during evaluation: PACU Anesthesia Type: General Level of consciousness: sedated and patient cooperative Pain management: pain level controlled Vital Signs Assessment: post-procedure vital signs reviewed and stable Respiratory status: spontaneous breathing Cardiovascular status: stable Anesthetic complications: no    Last Vitals:  Vitals:   03/30/18 1445 03/30/18 1515  BP: (!) 148/80 (!) (P) 147/96  Pulse: 76 (P) 83  Resp: 12 (P) 16  Temp:  (P) 36.6 C  SpO2: 96% (P) 99%    Last Pain:  Vitals:   03/30/18 1445  TempSrc:   PainSc: 0-No pain                 Edward Chapman

## 2018-03-30 NOTE — Transfer of Care (Addendum)
Immediate Anesthesia Transfer of Care Note  Patient: Edward Chapman  Procedure(s) Performed: RIGHT SMALL FINGER STAGE 2 FLEXOR TENDON RECONSTRUCTION WITH TENDON GRAFT (Left )  Patient Location: PACU  Anesthesia Type:General  Level of Consciousness: awake, alert  and drowsy  Airway & Oxygen Therapy: Patient Spontanous Breathing and Patient connected to face mask oxygen  Post-op Assessment: Report given to RN and Post -op Vital signs reviewed and stable  Post vital signs: Reviewed and stable  Last Vitals:  Vitals Value Taken Time  BP 131/95 03/30/2018 10:30 AM  Temp    Pulse 70 03/30/2018 10:38 AM  Resp 13 03/30/2018 10:38 AM  SpO2 100 % 03/30/2018 10:38 AM  Vitals shown include unvalidated device data.  Last Pain:  Vitals:   03/30/18 0854  TempSrc: Oral  PainSc: 0-No pain      Patients Stated Pain Goal: 6 (03/30/18 0854)  Complications: No apparent anesthesia complications

## 2018-03-30 NOTE — Anesthesia Preprocedure Evaluation (Addendum)
Anesthesia Evaluation  Patient identified by MRN, date of birth, ID band Patient awake    Reviewed: Allergy & Precautions, NPO status , Patient's Chart, lab work & pertinent test results  Airway Mallampati: II  TM Distance: >3 FB Neck ROM: Full    Dental  (+) Teeth Intact, Dental Advisory Given   Pulmonary neg pulmonary ROS,    Pulmonary exam normal breath sounds clear to auscultation       Cardiovascular Exercise Tolerance: Good negative cardio ROS Normal cardiovascular exam Rhythm:Regular Rate:Normal     Neuro/Psych negative neurological ROS     GI/Hepatic negative GI ROS, Neg liver ROS,   Endo/Other  Obesity   Renal/GU negative Renal ROS     Musculoskeletal negative musculoskeletal ROS (+)   Abdominal   Peds  (+) ATTENTION DEFICIT DISORDER WITHOUT HYPERACTIVITY Hematology negative hematology ROS (+)   Anesthesia Other Findings Day of surgery medications reviewed with the patient.  Reproductive/Obstetrics                             Anesthesia Physical Anesthesia Plan  ASA: II  Anesthesia Plan: General   Post-op Pain Management:  Regional for Post-op pain   Induction: Intravenous  PONV Risk Score and Plan: 2 and Midazolam, Dexamethasone and Ondansetron  Airway Management Planned: LMA  Additional Equipment:   Intra-op Plan:   Post-operative Plan: Extubation in OR  Informed Consent: I have reviewed the patients History and Physical, chart, labs and discussed the procedure including the risks, benefits and alternatives for the proposed anesthesia with the patient or authorized representative who has indicated his/her understanding and acceptance.   Dental advisory given  Plan Discussed with: CRNA  Anesthesia Plan Comments:        Anesthesia Quick Evaluation

## 2018-03-30 NOTE — Discharge Instructions (Addendum)
° °  ° ° ° °Hand Center Instructions °Hand Surgery ° °Wound Care: °Keep your hand elevated above the level of your heart.  Do not allow it to dangle by your side.  Keep the dressing dry and do not remove it unless your doctor advises you to do so.  He will usually change it at the time of your post-op visit.  Moving your fingers is advised to stimulate circulation but will depend on the site of your surgery.  If you have a splint applied, your doctor will advise you regarding movement. ° °Activity: °Do not drive or operate machinery today.  Rest today and then you may return to your normal activity and work as indicated by your physician. ° °Diet:  °Drink liquids today or eat a light diet.  You may resume a regular diet tomorrow.   ° °General expectations: °Pain for two to three days. °Fingers may become slightly swollen. ° °Call your doctor if any of the following occur: °Severe pain not relieved by pain medication. °Elevated temperature. °Dressing soaked with blood. °Inability to move fingers. °White or bluish color to fingers. ° ° °Regional Anesthesia Blocks ° °1. Numbness or the inability to move the "blocked" extremity may last from 3-48 hours after placement. The length of time depends on the medication injected and your individual response to the medication. If the numbness is not going away after 48 hours, call your surgeon. ° °2. The extremity that is blocked will need to be protected until the numbness is gone and the  Strength has returned. Because you cannot feel it, you will need to take extra care to avoid injury. Because it may be weak, you may have difficulty moving it or using it. You may not know what position it is in without looking at it while the block is in effect. ° °3. For blocks in the legs and feet, returning to weight bearing and walking needs to be done carefully. You will need to wait until the numbness is entirely gone and the strength has returned. You should be able to move your leg  and foot normally before you try and bear weight or walk. You will need someone to be with you when you first try to ensure you do not fall and possibly risk injury. ° °4. Bruising and tenderness at the needle site are common side effects and will resolve in a few days. ° °5. Persistent numbness or new problems with movement should be communicated to the surgeon or the [facility] ([phone])/ [facility] ([phone]). ° ° ° °Post Anesthesia Home Care Instructions ° °Activity: °Get plenty of rest for the remainder of the day. A responsible individual must stay with you for 24 hours following the procedure.  °For the next 24 hours, DO NOT: °-Drive a car °-Operate machinery °-Drink alcoholic beverages °-Take any medication unless instructed by your physician °-Make any legal decisions or sign important papers. ° °Meals: °Start with liquid foods such as gelatin or soup. Progress to regular foods as tolerated. Avoid greasy, spicy, heavy foods. If nausea and/or vomiting occur, drink only clear liquids until the nausea and/or vomiting subsides. Call your physician if vomiting continues. ° °Special Instructions/Symptoms: °Your throat may feel dry or sore from the anesthesia or the breathing tube placed in your throat during surgery. If this causes discomfort, gargle with warm salt water. The discomfort should disappear within 24 hours. ° °If you had a scopolamine patch placed behind your ear for   the management of post- operative nausea and/or vomiting: ° °1. The medication in the patch is effective for 72 hours, after which it should be removed.  Wrap patch in a tissue and discard in the trash. Wash hands thoroughly with soap and water. °2. You may remove the patch earlier than 72 hours if you experience unpleasant side effects which may include dry mouth, dizziness or visual disturbances. °3. Avoid touching the patch. Wash your hands with soap and water after contact with the patch. °  ° ° °

## 2018-03-30 NOTE — Anesthesia Procedure Notes (Signed)
Anesthesia Regional Block: Axillary brachial plexus block   Pre-Anesthetic Checklist: ,, timeout performed, Correct Patient, Correct Site, Correct Laterality, Correct Procedure, Correct Position, site marked, Risks and benefits discussed,  Surgical consent,  Pre-op evaluation,  At surgeon's request and post-op pain management  Laterality: Right  Prep: chloraprep       Needles:  Injection technique: Single-shot  Needle Type: Echogenic Needle     Needle Length: 9cm  Needle Gauge: 21     Additional Needles:   Procedures:,,,, ultrasound used (permanent image in chart),,,,  Narrative:  Start time: 03/30/2018 10:23 AM End time: 03/30/2018 10:28 AM Injection made incrementally with aspirations every 5 mL.  Performed by: Personally  Anesthesiologist: Cecile Hearing, MD  Additional Notes: No pain on injection. No increased resistance to injection. Injection made in 5cc increments.  Good needle visualization.  Patient tolerated procedure well.

## 2018-03-30 NOTE — Op Note (Signed)
NAME: Edward Chapman MEDICAL RECORD NO: [id number] DATE OF BIRTH: July 20, 1960 FACILITY: [facility] LOCATION: [facility] PHYSICIAN: Tami Ribas, MD   OPERATIVE REPORT   DATE OF PROCEDURE: 03/30/18    PREOPERATIVE DIAGNOSIS: Right small finger flexor tendon rupture status post stage I flexor tendon reconstruction with Hunter rod placement  POSTOPERATIVE DIAGNOSIS:  Right small finger flexor tendon rupture status post stage I flexor tendon reconstruction with Hunter rod placement   PROCEDURE:  Right small finger stage II flexor tendon reconstruction with removal of Hunter rod placement of palmaris longus graft from left arm    SURGEON:  Betha Loa, M.D.   ASSISTANT: Cindee Salt, MD   ANESTHESIA:  General with regional   INTRAVENOUS FLUIDS:  Per anesthesia flow sheet.   ESTIMATED BLOOD LOSS:  Minimal.   COMPLICATIONS:  None.   SPECIMENS:  none   TOURNIQUET TIME:    Total Tourniquet Time Documented: Upper Arm (Right) - 131 minutes Upper Arm (Right) - 17 minutes Total: Upper Arm (Right) - 148 minutes    DISPOSITION:  Stable to PACU.   INDICATIONS: 57 year old male who sustained a laceration to the right small finger and has undergone repair with subsequent reconstruction and failure of reconstruction now presents for stage II of flexor tendon reconstruction after Hunter rod placement. Risks, benefits and alternatives of surgery were discussed including the risks of blood loss, infection, damage to nerves, vessels, tendons, ligaments, bone for surgery, need for additional surgery, complications with wound healing, continued pain, nonunion, malunion, stiffness.  He voiced understanding of these risks and elected to proceed.  OPERATIVE COURSE:  After being identified preoperatively by myself,  the patient and I agreed on the procedure and site of the procedure.  The surgical site was marked.  Surgical consent had been signed. He was given IV Ancef as preoperative  antibiotic prophylaxis. He was transferred to the operating room and placed on the operating table in supine position with the Left upper extremity on an arm board.  General anesthesia was induced by the anesthesiologist. A regional block had been performed by anesthesia in preoperative holding.   Left upper extremity was prepped and draped in normal sterile orthopedic fashion.  A surgical pause was performed between the surgeons, anesthesia, and operating room staff and all were in agreement as to the patient, procedure, and site of procedure.  Tourniquet at the proximal aspect of the extremity was inflated to 250 mmHg after exsanguination of the arm with an Esmarch bandage.    Previous incisions were followed.  Incision was made first distally at the DIP joint.  The Hunter rod was identified in this location.  There was significant scar formation.  The Hunter rod was in place.  Incision was made in the distal palm.  The Hunter rod was again identified.  There was no remaining Eksir tendon at this location.  Incision was made at the forearm at the radial side of his previous skin graft.  This was carried in subtenons tissues by spreading technique.  He did not have a palmaris longus in this location.  The median nerve was identified and protected.  It was very superficial near the wrist.  The Hunter rod was difficult to locate.  After searching for it the C-arm was used and it was noted to be at the radial side of the wrist.  The FDP tendon stump to the small finger was identified.  A Carroll tendon passer was passed from the palm wound through the  carpal tunnel into the wrist wound.  A pediatric feeding tube was then placed through this tract.  The wounds were covered.  The left upper extremities prepped and draped in normal orthopedic fashion.  Incision was made at the wrist transversely.  The palmaris longus identified.  He was placed under traction.  It was palpated more proximally.  An additional incision was  made in the proximal forearm.  The palmar longus was released at this location at its muscle belly.  It was then harvested through the distal wound.  The wounds were closed with 4-0 nylon in a horizontal mattress fashion.  There were injected with quarter percent plain Marcaine to aid in postoperative analgesia.  They were dressed with sterile Xeroform 4 x 4's and the proximal wound covered with an OpSite dressing and the distal wound wrapped with a Coban dressing lightly.  The palmar is longus graft was taken to the right side.  Muscle belly was removed.  The graft was passed through the canal formed by the [facility] rod in the [employer].  It was then secured to the distal phalanx over a button using 2-0 Prolene sutures.  This provided good re-apposition of the tendon graft to the base of the distal phalanx.  The palmar and finger wounds were closed with 4-0 nylon in a horizontal mattress fashion.  The fingers were flexed.  This allowed placement of the Pulvertaft weave at the proximal wound placing the tendon graft into the FDP stump to the small finger.  This was sewn with 4-0 FiberWire suture.  Three passes were achieved.  The fingers were released and there was good cascade of all digits.  The wound was then closed with 4-0 nylon horizontal mattress fashion.  All wounds were dressed with sterile Xeroform 4 x 4's and wrapped with a Kerlix bandage.  Dorsal blocking splint was placed with the wrist approximately 30 to 40 degrees flexion the MPs flexed the IP is extended.  This was wrapped with Kerlix and Ace bandage.  The tourniquet was deflated at 131 minutes.  Fingertips were pink with brisk capillary refill after deflation of tourniquet.  The operative  drapes were broken down.  The patient was awoken from anesthesia safely.  He was transferred back to the stretcher and taken to PACU in stable condition.  I will see him back in the office in 1 week for postoperative followup.  I will give him a  prescription for Norco 5/325 1-2 tabs PO q6 hours prn pain, dispense # 30.   Betha Loa, MD Electronically signed, 03/30/18

## 2018-03-30 NOTE — Anesthesia Procedure Notes (Signed)
Procedure Name: LMA Insertion Performed by: Ronnette Hila, CRNA Pre-anesthesia Checklist: Patient identified, Emergency Drugs available, Suction available, Patient being monitored and Timeout performed Patient Re-evaluated:Patient Re-evaluated prior to induction Oxygen Delivery Method: Circle system utilized Preoxygenation: Pre-oxygenation with 100% oxygen Induction Type: IV induction LMA: LMA inserted LMA Size: 5.0 Tube type: Oral Placement Confirmation: positive ETCO2,  CO2 detector and breath sounds checked- equal and bilateral Tube secured with: Tape Dental Injury: Teeth and Oropharynx as per pre-operative assessment

## 2018-03-30 NOTE — Progress Notes (Signed)
Assisted Dr. Turk with right, ultrasound guided, axillary block. Side rails up, monitors on throughout procedure. See vital signs in flow sheet. Tolerated Procedure well. 

## 2018-03-31 ENCOUNTER — Encounter (HOSPITAL_BASED_OUTPATIENT_CLINIC_OR_DEPARTMENT_OTHER): Payer: Self-pay | Admitting: Orthopedic Surgery

## 2018-07-31 IMAGING — US US EXTREM UP*R* COMP
1 series · 14 of 14 positions shown · non-contrast
Comparison: None.

CLINICAL DATA: Laceration of the flexor muscle, fascia, and tendons
of the right little finger at the wrist and hand level.

EXAM:
ULTRASOUND RIGHT UPPER EXTREMITY COMPLETE
TECHNIQUE: Ultrasound examination was performed including evaluation of the
muscles, tendons, joint, and adjacent soft tissues.

[Series 1: us extrem up*right* comp · 0.07mm/px · 14 acquisitions, 14 frames shown]
[im 1/14]
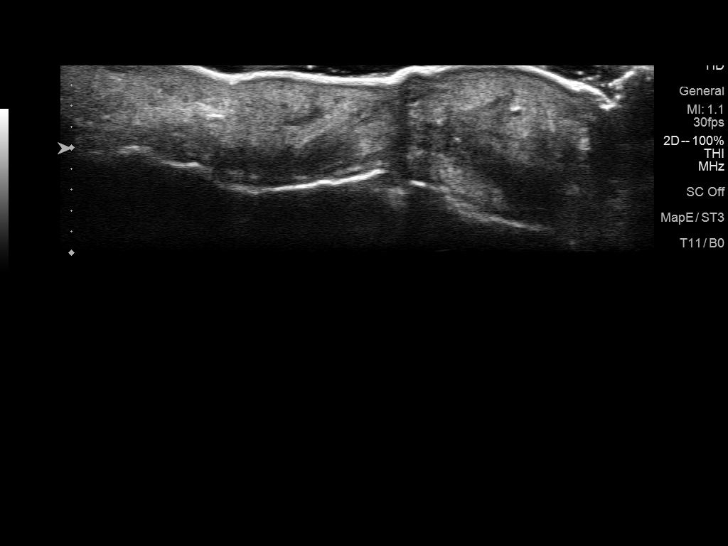
[im 2/14]
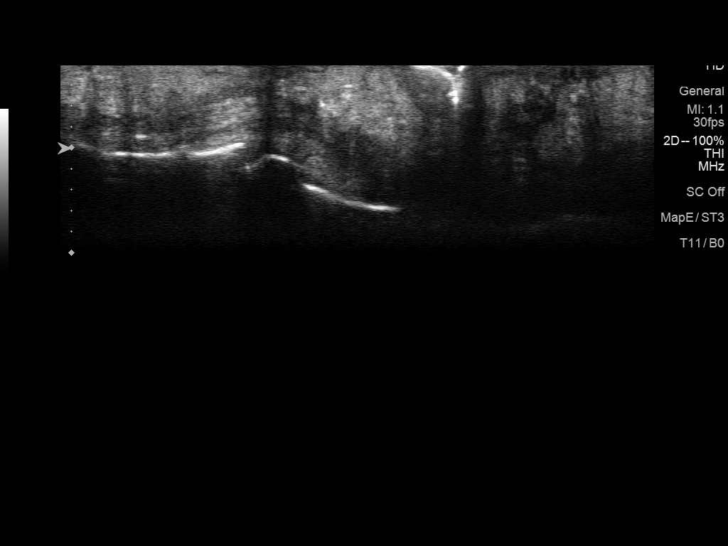
[im 3/14]
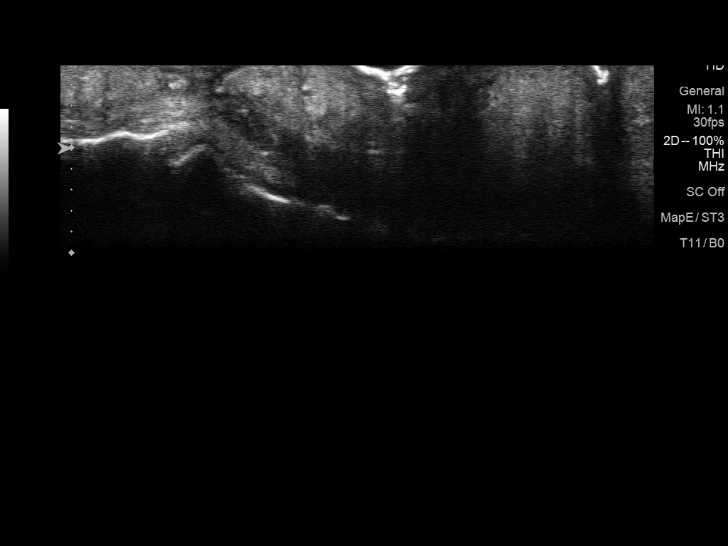
[im 4/14]
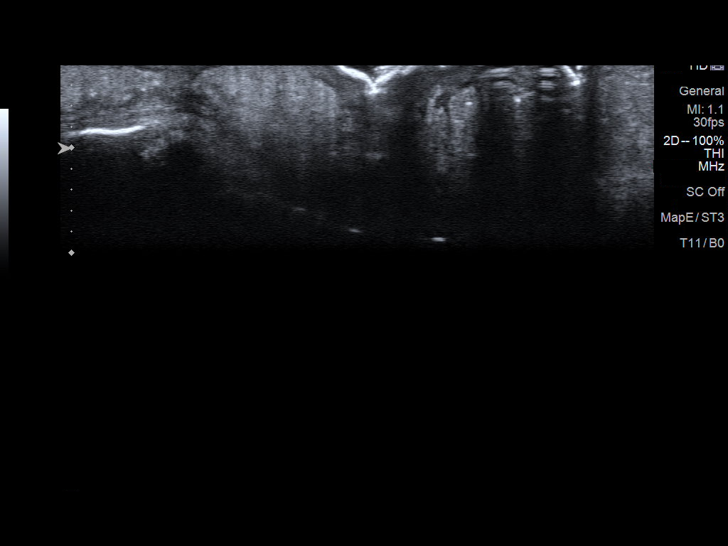
[im 5/14]
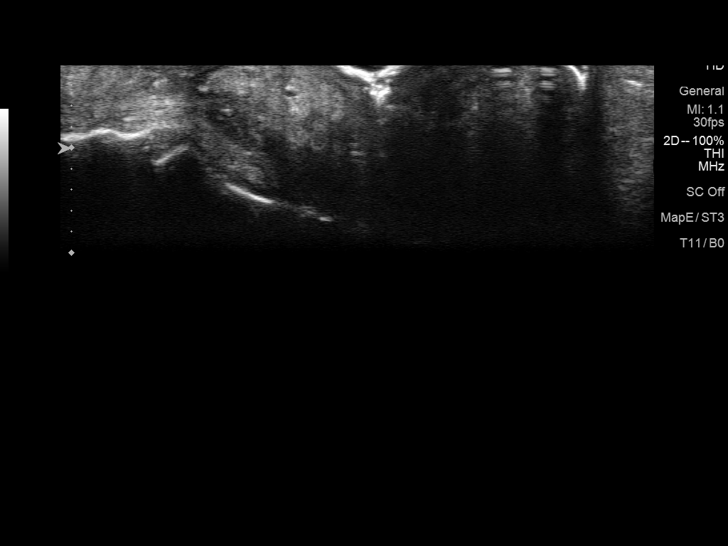
[im 6/14]
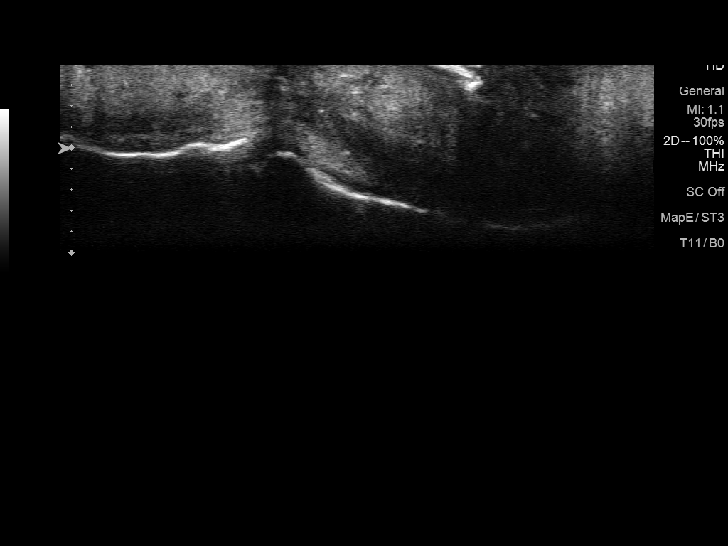
[im 7/14]
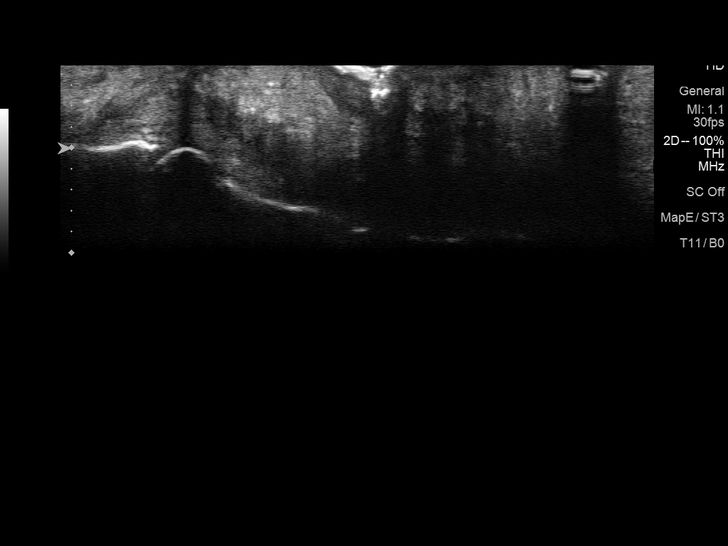
[im 8/14]
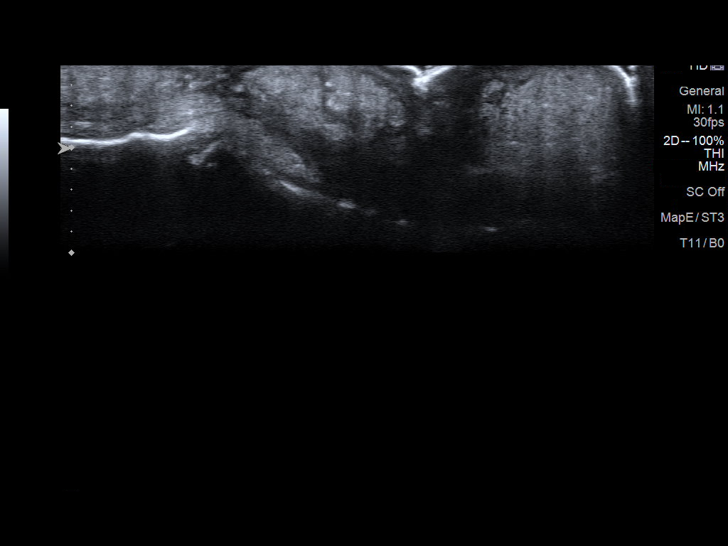
[im 9/14]
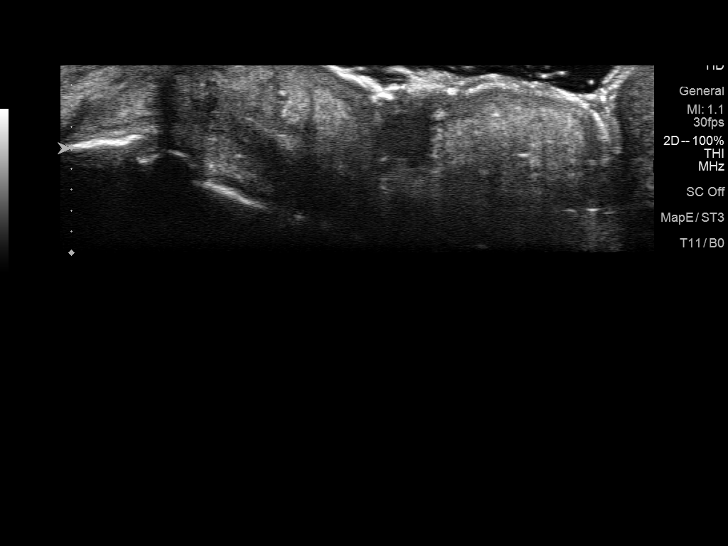
[im 10/14]
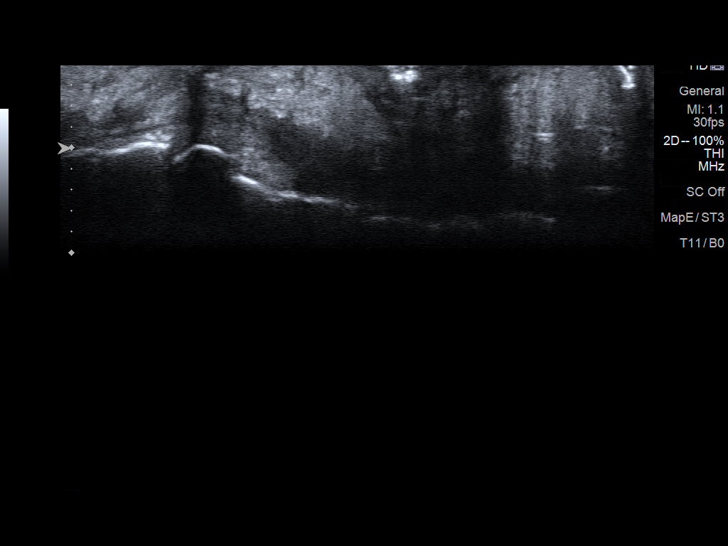
[im 11/14]
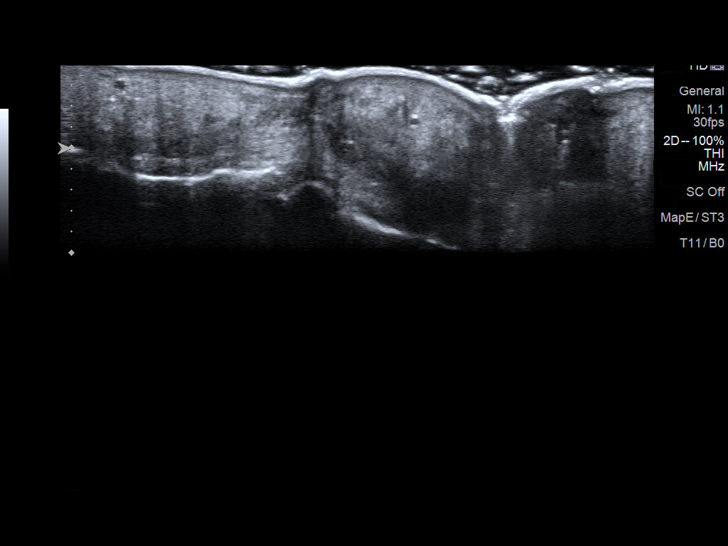
[im 12/14]
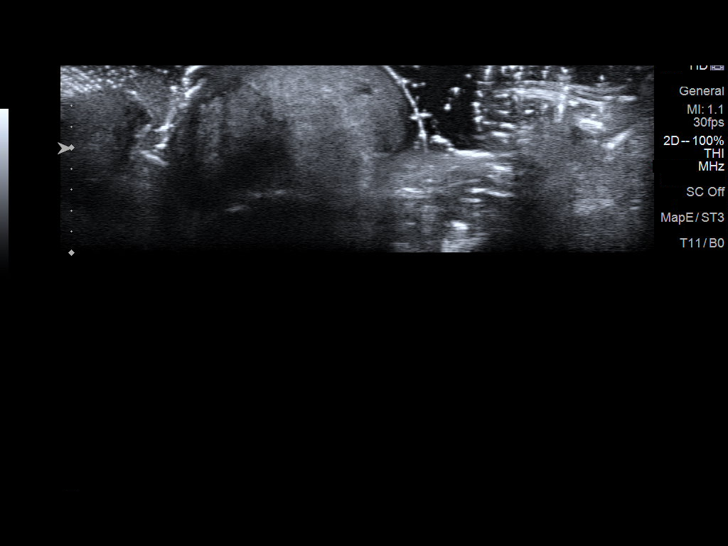
[im 13/14]
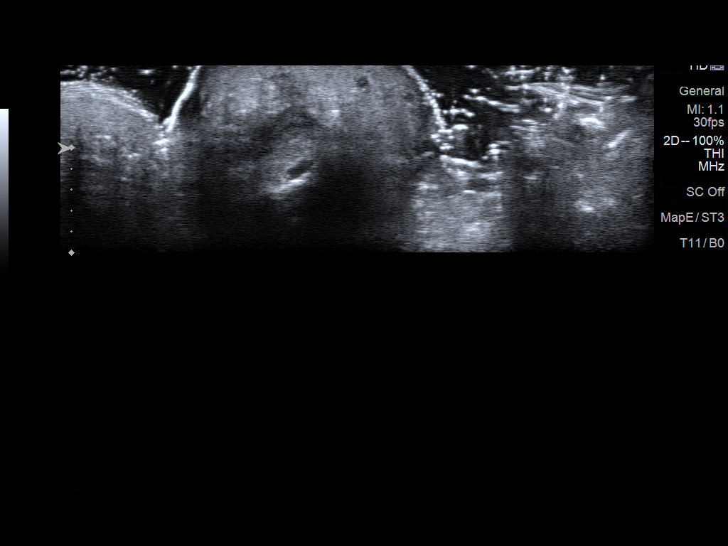
[im 14/14]
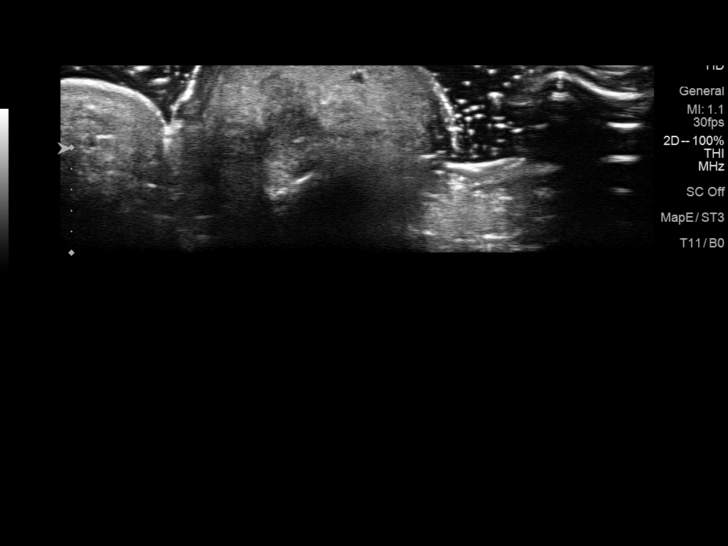

[14 of 14 positions shown; findings below may reference images not displayed]

FINDINGS: Tendons: There is disruption of the flexor tendons just proximal to
the fifth MCP joint with approximately 15 mm of retraction. The
tendons are intact at the level of the proximal phalanx of the
little finger. No significant fluid adjacent to the tendons.

There is no joint effusion at the MCP joint.
IMPRESSION: Disruption of the surgical repair of the flexor tendons just
proximal to the fifth MCP joint.

## 2018-10-16 IMAGING — US US EXTREM UP *R* LTD
1 series · 13 of 17 positions shown · non-contrast
Comparison: None

CLINICAL DATA: Limited range of motion and swelling of the right
little finger. Prior repairs of the flexor tendons with graft.

EXAM:
ULTRASOUND RIGHT UPPER EXTREMITY LIMITED
TECHNIQUE: Ultrasound examination of the upper extremity soft tissues was
performed in the area of clinical concern.

[Series 1: us extrem up *right* ltd · 0.05mm/px · 17 acquisitions, 13 frames shown]
[im 1/17]
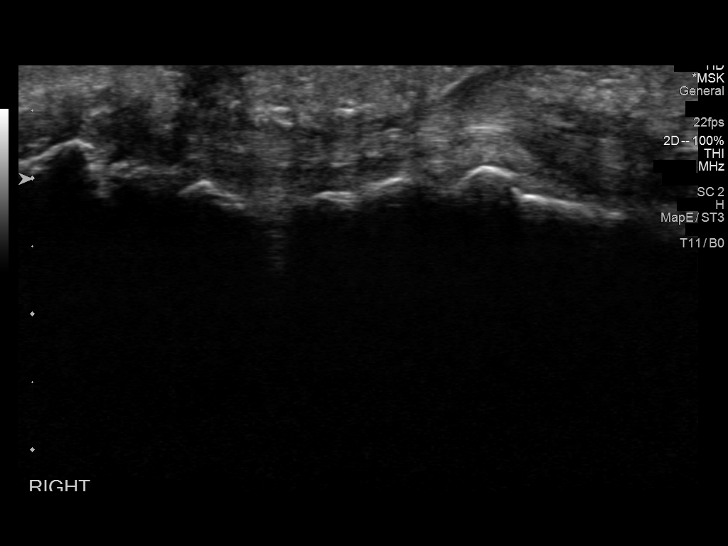
[im 2/17]
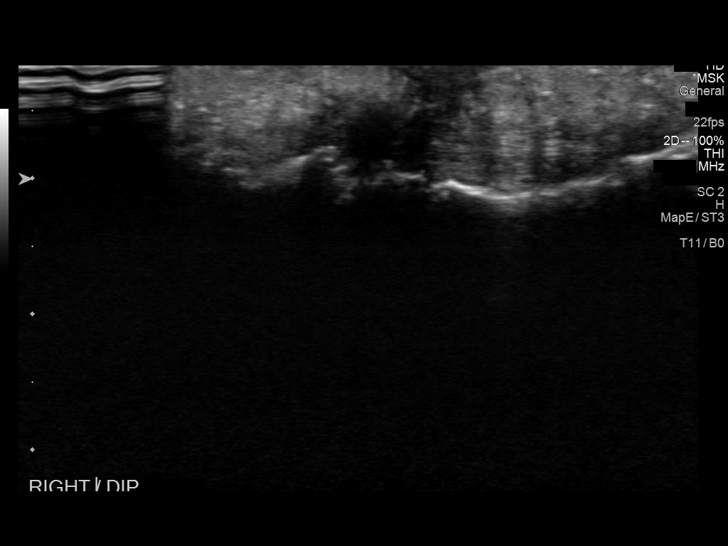
[im 4/17]
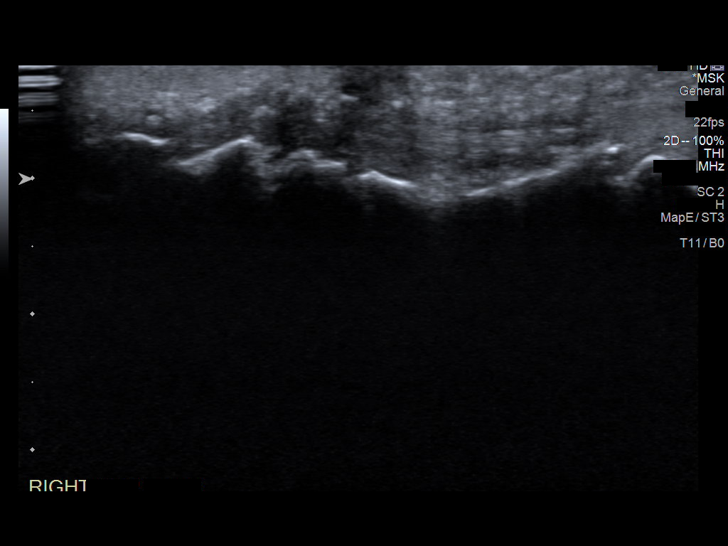
[im 5/17]
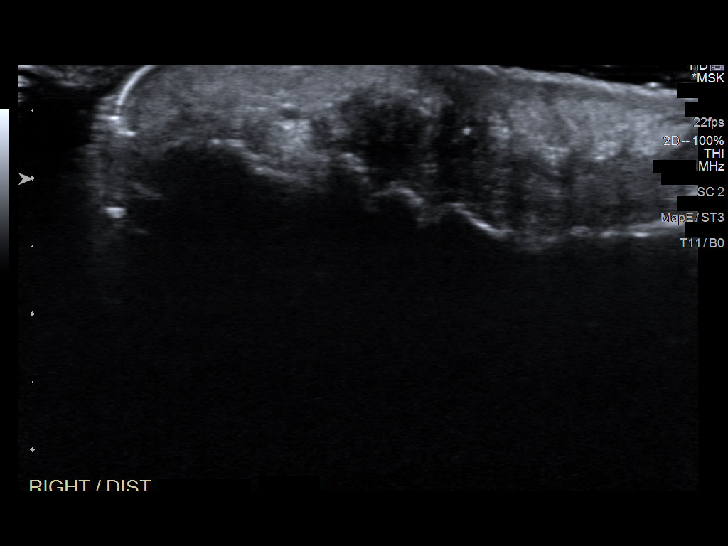
[im 6/17]
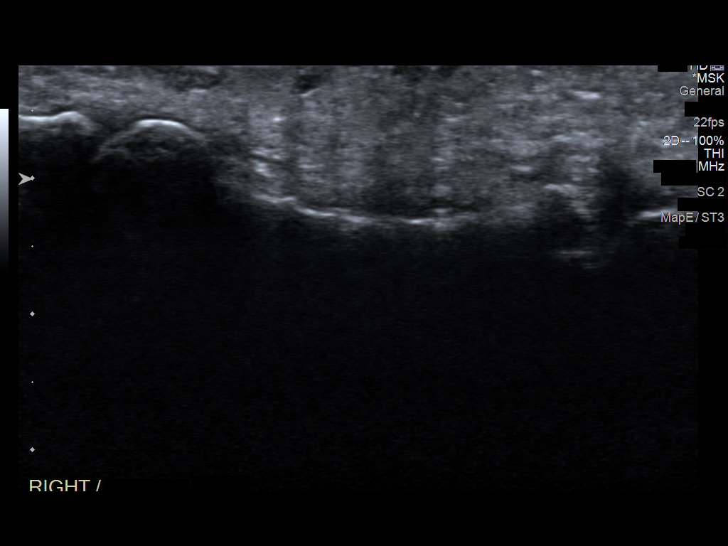
[im 8/17]
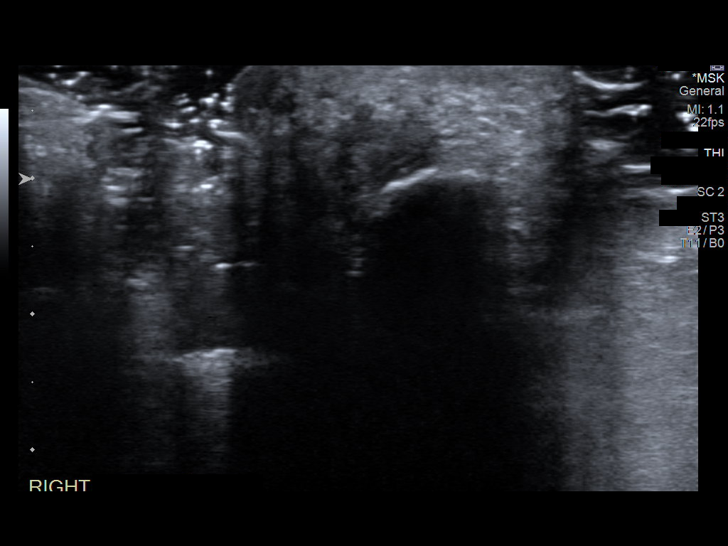
[im 9/17]
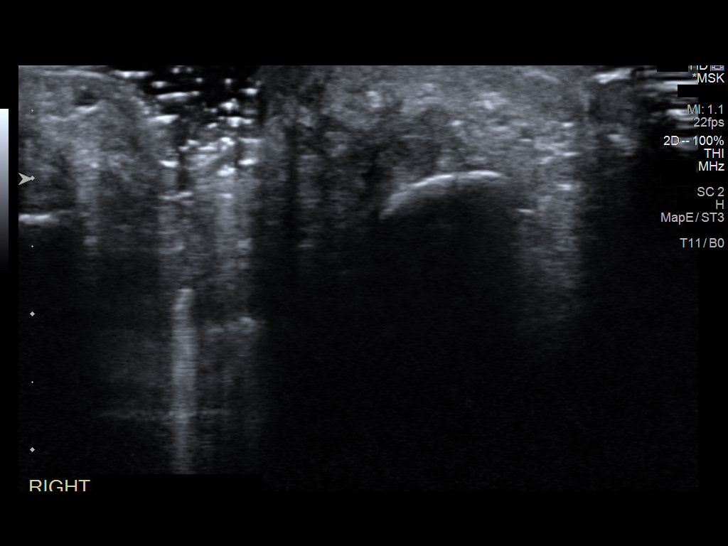
[im 10/17]
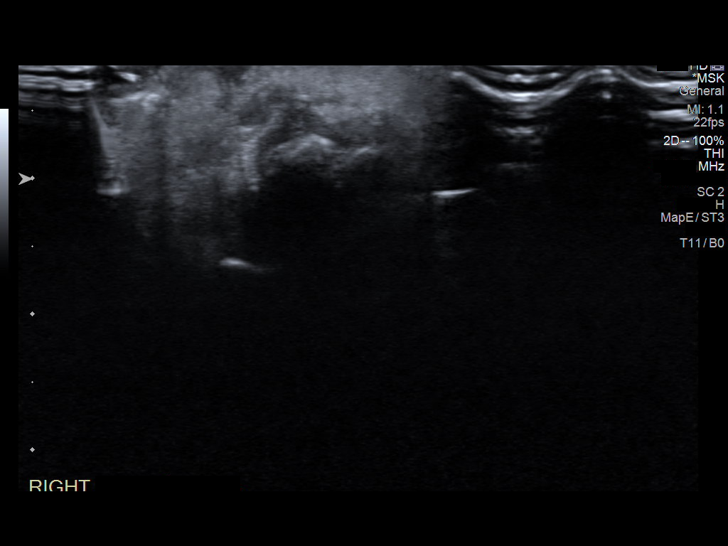
[im 12/17]
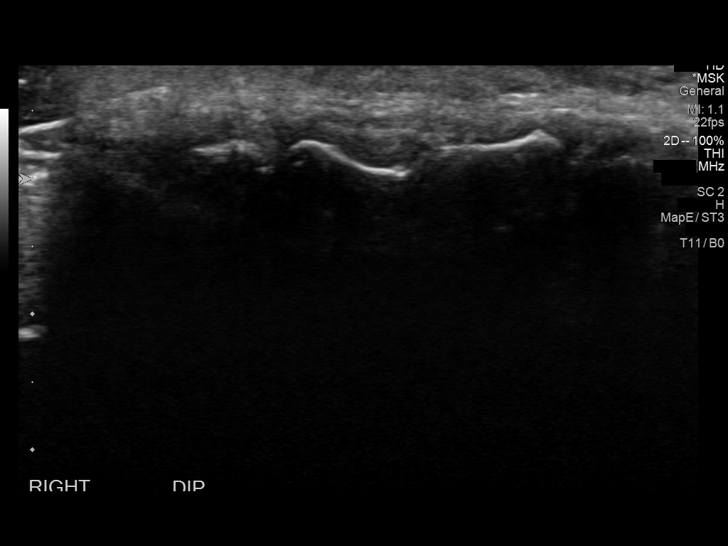
[im 13/17]
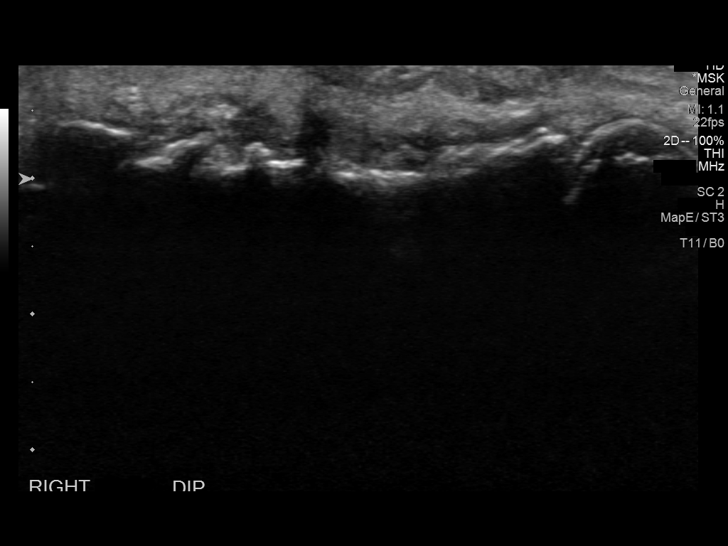
[im 14/17]
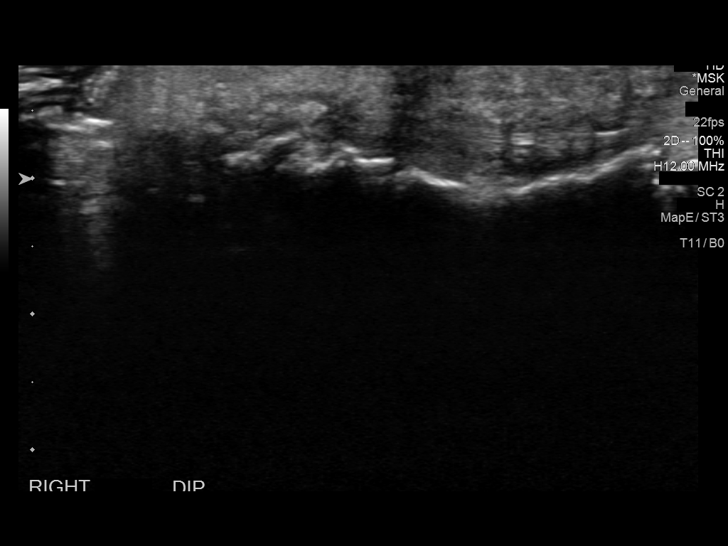
[im 16/17]
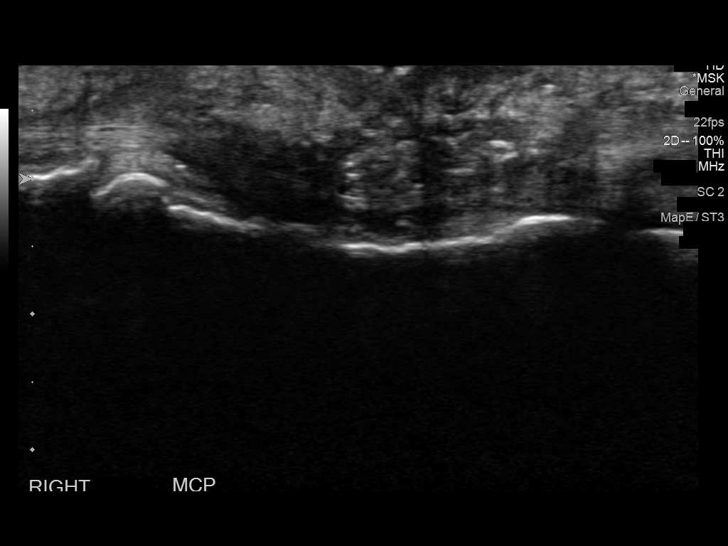
[im 17/17]
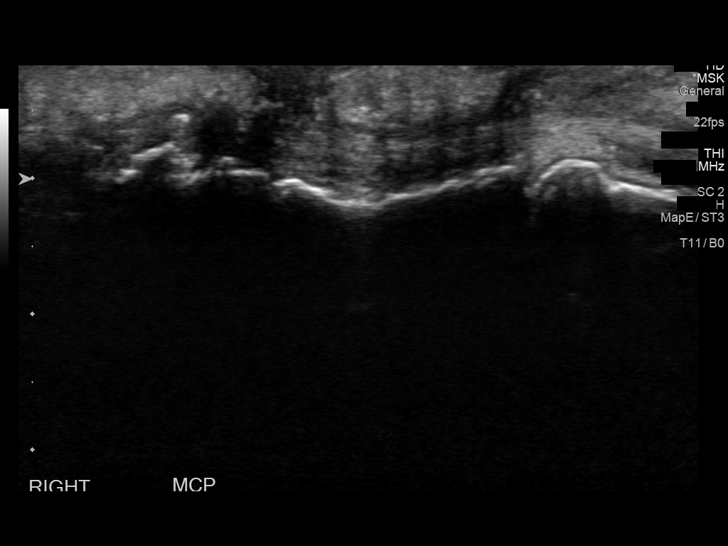

[13 of 17 positions shown; findings below may reference images not displayed]

FINDINGS: I performed realtime ultrasound with the 18 megahertz transducer.
The flexor tendons appear intact at the level of the MCP joint and
PIP joint. The tendon graft seen entering the defect in the volar
aspect of the distal phalanx and appears intact at that site.
However, the level of the volar aspect of the DIP joint there is an
area of marked decreased echogenicity which may represent an area of
scarring or focal defect in the tendon. I cannot identify tendon in
this area but the tendon just proximal to this area demonstrates no
laxity.

Longitudinal images between the MCP joint and PIP joint demonstrate
what appears to be extensive scarring around the flexor tendon of
the repaired tendon appears to be intact at that level.
IMPRESSION: 1. Prominent scarring around the flexor tendon at the level of the
proximal phalanx of the right little finger as well as at the level
of the DIP joint.
2. I am unable to identify the tendon at the level of the DIP joint
because of the scarring. I cannot exclude a discontinuity of the
tendon at that site although the tendon appears intact as it enters
the distal phalanx.

## 2019-02-27 DIAGNOSIS — E7800 Pure hypercholesterolemia, unspecified: Secondary | ICD-10-CM | POA: Diagnosis not present

## 2019-02-27 DIAGNOSIS — Z0000 Encounter for general adult medical examination without abnormal findings: Secondary | ICD-10-CM | POA: Diagnosis not present

## 2019-02-27 DIAGNOSIS — Z125 Encounter for screening for malignant neoplasm of prostate: Secondary | ICD-10-CM | POA: Diagnosis not present

## 2019-02-27 DIAGNOSIS — F900 Attention-deficit hyperactivity disorder, predominantly inattentive type: Secondary | ICD-10-CM | POA: Diagnosis not present

## 2019-03-28 DIAGNOSIS — Z5181 Encounter for therapeutic drug level monitoring: Secondary | ICD-10-CM | POA: Diagnosis not present

## 2019-03-28 DIAGNOSIS — E7800 Pure hypercholesterolemia, unspecified: Secondary | ICD-10-CM | POA: Diagnosis not present

## 2019-03-28 DIAGNOSIS — Z125 Encounter for screening for malignant neoplasm of prostate: Secondary | ICD-10-CM | POA: Diagnosis not present

## 2019-03-28 DIAGNOSIS — Z23 Encounter for immunization: Secondary | ICD-10-CM | POA: Diagnosis not present

## 2019-05-01 DIAGNOSIS — Z20828 Contact with and (suspected) exposure to other viral communicable diseases: Secondary | ICD-10-CM | POA: Diagnosis not present

## 2023-08-24 ENCOUNTER — Telehealth: Payer: Self-pay | Admitting: Hematology and Oncology

## 2023-08-24 NOTE — Telephone Encounter (Signed)
 Called and left VM for patient to call back to schedule an appointment.

## 2023-08-25 ENCOUNTER — Telehealth: Payer: Self-pay | Admitting: Hematology and Oncology

## 2023-08-25 NOTE — Telephone Encounter (Signed)
Called patient per chat to schedule urgent heme with Dr. Bertis Ruddy @2  on 2/21. Left message to call office back to schedule New Patient referral appt. Called and left VM for patient to call back to schedule an appointment.

## 2023-09-07 ENCOUNTER — Other Ambulatory Visit: Payer: Self-pay | Admitting: Medical Oncology

## 2023-09-07 DIAGNOSIS — R718 Other abnormality of red blood cells: Secondary | ICD-10-CM

## 2023-09-08 ENCOUNTER — Inpatient Hospital Stay: Payer: Self-pay | Attending: Internal Medicine | Admitting: Internal Medicine

## 2023-09-08 ENCOUNTER — Inpatient Hospital Stay

## 2023-09-08 ENCOUNTER — Inpatient Hospital Stay: Payer: Self-pay

## 2023-09-08 VITALS — BP 113/78 | HR 100 | Temp 98.2°F | Resp 18

## 2023-09-08 VITALS — BP 131/79 | HR 105 | Temp 98.1°F | Resp 18 | Ht 74.0 in | Wt 256.6 lb

## 2023-09-08 DIAGNOSIS — F909 Attention-deficit hyperactivity disorder, unspecified type: Secondary | ICD-10-CM | POA: Diagnosis not present

## 2023-09-08 DIAGNOSIS — R7989 Other specified abnormal findings of blood chemistry: Secondary | ICD-10-CM | POA: Insufficient documentation

## 2023-09-08 DIAGNOSIS — Z7982 Long term (current) use of aspirin: Secondary | ICD-10-CM | POA: Insufficient documentation

## 2023-09-08 DIAGNOSIS — R718 Other abnormality of red blood cells: Secondary | ICD-10-CM

## 2023-09-08 DIAGNOSIS — I10 Essential (primary) hypertension: Secondary | ICD-10-CM | POA: Insufficient documentation

## 2023-09-08 DIAGNOSIS — E785 Hyperlipidemia, unspecified: Secondary | ICD-10-CM | POA: Diagnosis not present

## 2023-09-08 DIAGNOSIS — D751 Secondary polycythemia: Secondary | ICD-10-CM | POA: Diagnosis present

## 2023-09-08 DIAGNOSIS — Z803 Family history of malignant neoplasm of breast: Secondary | ICD-10-CM

## 2023-09-08 LAB — CMP (CANCER CENTER ONLY)
ALT: 68 U/L — ABNORMAL HIGH (ref 0–44)
AST: 40 U/L (ref 15–41)
Albumin: 4.5 g/dL (ref 3.5–5.0)
Alkaline Phosphatase: 65 U/L (ref 38–126)
Anion gap: 7 (ref 5–15)
BUN: 16 mg/dL (ref 6–20)
CO2: 25 mmol/L (ref 22–32)
Calcium: 9.7 mg/dL (ref 8.9–10.3)
Chloride: 105 mmol/L (ref 98–111)
Creatinine: 1.15 mg/dL (ref 0.61–1.24)
GFR, Estimated: >60 mL/min (ref >60)
Glucose, Bld: 127 mg/dL — ABNORMAL HIGH (ref 70–99)
Potassium: 4.1 mmol/L (ref 3.5–5.1)
Sodium: 137 mmol/L (ref 135–145)
Total Bilirubin: 1 mg/dL (ref 0.0–1.2)
Total Protein: 7.9 g/dL (ref 6.5–8.1)

## 2023-09-08 LAB — CBC WITH DIFFERENTIAL (CANCER CENTER ONLY)
Abs Immature Granulocytes: 0.03 10*3/uL (ref 0.00–0.07)
Basophils Absolute: 0.1 10*3/uL (ref 0.0–0.1)
Basophils Relative: 1 %
Eosinophils Absolute: 0.1 10*3/uL (ref 0.0–0.5)
Eosinophils Relative: 2 %
HCT: 53 % — ABNORMAL HIGH (ref 39.0–52.0)
Hemoglobin: 18.5 g/dL — ABNORMAL HIGH (ref 13.0–17.0)
Immature Granulocytes: 0 %
Lymphocytes Relative: 27 %
Lymphs Abs: 1.8 10*3/uL (ref 0.7–4.0)
MCH: 30.7 pg (ref 26.0–34.0)
MCHC: 34.9 g/dL (ref 30.0–36.0)
MCV: 88 fL (ref 80.0–100.0)
Monocytes Absolute: 1 10*3/uL (ref 0.1–1.0)
Monocytes Relative: 14 %
Neutro Abs: 3.8 10*3/uL (ref 1.7–7.7)
Neutrophils Relative %: 56 %
Platelet Count: 223 10*3/uL (ref 150–400)
RBC: 6.02 MIL/uL — ABNORMAL HIGH (ref 4.22–5.81)
RDW: 13 % (ref 11.5–15.5)
WBC Count: 6.8 10*3/uL (ref 4.0–10.5)
nRBC: 0 % (ref 0.0–0.2)

## 2023-09-08 LAB — IRON AND IRON BINDING CAPACITY (CC-WL,HP ONLY)
Iron: 162 ug/dL (ref 45–182)
Saturation Ratios: 46 % — ABNORMAL HIGH (ref 17.9–39.5)
TIBC: 353 ug/dL (ref 250–450)
UIBC: 191 ug/dL (ref 117–376)

## 2023-09-08 LAB — LACTATE DEHYDROGENASE: LDH: 191 U/L (ref 98–192)

## 2023-09-08 LAB — FERRITIN: Ferritin: 122 ng/mL (ref 24–336)

## 2023-09-08 NOTE — Patient Instructions (Signed)

## 2023-09-08 NOTE — Progress Notes (Signed)
 [facility] [phone]   [phone]  CONSULT NOTE  REFERRING PHYSICIAN: Lauretta Chester, FNP  REASON FOR CONSULTATION:  63 years old white male with polycythemia  HPI Edward Chapman is a 63 y.o. male.   Discussed the use of AI scribe software for clinical note transcription with the patient, who gave verbal consent to proceed.  History of Present Illness   Edward Chapman is a 63 year old male who presents with elevated hemoglobin and hematocrit levels. He was referred by his primary care provider for evaluation of high hemoglobin and hematocrit levels.  Recent blood work revealed elevated hemoglobin of 19.3 and hematocrit of 57.0, prompting the referral. He has a history of regular blood donation at the [facility], approximately four times a year for the past four to five years, which may have masked his condition. He has not donated blood recently.  No symptoms such as dizziness, breathing difficulties, chest pain, shortness of breath, nausea, vomiting, diarrhea, or bleeding. He denies itching after hot showers. He does not consume a lot of red meat and has cut back due to cost, primarily eating chicken. He does not take any iron supplements.  His past medical history includes attention deficit hyperactivity disorder (ADHD), high blood pressure, high cholesterol, and anxiety. He is currently on medications including Celebrex, Vicodin, Pravastatin, Intuniv, and lisdexamfetamine dimesylate. He does not recall the specific medications for his blood pressure.  Family history is significant for his father having lung issues, specifically fibrosis, and his mother having breast cancer, which led to complications from chemotherapy. He has no siblings with cancer or blood disorders.  Socially, he is divorced with three sons and works as a Education administrator. He has never smoked, does not use street drugs, and consumes alcohol occasionally. He is not allergic to any  medications.      HPI  Past Medical History:  Diagnosis Date   ADD (attention deficit disorder)    Flexor tendon rupture of hand    right hand    Past Surgical History:  Procedure Laterality Date   FLEXOR TENDON REPAIR Right 06/09/2017   Procedure: RIGHT FLEXOR TENDON REPAIR RECONSTRUCTION;  Surgeon: Betha Loa, MD;  Location: [facility];  Service: Orthopedics;  Laterality: Right;   FLEXOR TENDON REPAIR Right 12/15/2017   Procedure: RIGHT SMALL FINGER FLEXOR TENDON RECONSTRUCTION HUNTER OF PLACEMENT;  Surgeon: Betha Loa, MD;  Location: [facility];  Service: Orthopedics;  Laterality: Right;   FLEXOR TENDON REPAIR Left 03/30/2018   Procedure: RIGHT SMALL FINGER STAGE 2 FLEXOR TENDON RECONSTRUCTION WITH TENDON GRAFT;  Surgeon: Betha Loa, MD;  Location: [facility];  Service: Orthopedics;  Laterality: Left;   NERVE, TENDON AND ARTERY REPAIR Right 05/20/2017   Procedure: NERVE, TENDON AND ARTERY REPAIR RIGHT SMALL FINGER;  Surgeon: Betha Loa, MD;  Location: [facility];  Service: Orthopedics;  Laterality: Right;   TENDON TRANSFER Left 03/30/2018   Procedure: TENDON TRANSFER;  Surgeon: Betha Loa, MD;  Location: [facility];  Service: Orthopedics;  Laterality: Left;  Palmaris longis left graft   TENOLYSIS Right 11/08/2017   Procedure: RIGHT SMALL FLEXOR TENOLYSIS;  Surgeon: Betha Loa, MD;  Location: [facility];  Service: Orthopedics;  Laterality: Right;   WRIST SURGERY      No family history on file.  Social History Social History   Tobacco Use   Smoking status: Never   Smokeless tobacco: Never  Vaping Use  Vaping status: Never Used  Substance Use Topics   Alcohol use: Yes    Comment: occ   Drug use: No    No Known Allergies  Current Outpatient Medications  Medication Sig Dispense Refill   celecoxib (CELEBREX) 200 MG capsule Take 200 mg by mouth daily. 200mg  x2 daily once per  pt     guanFACINE (INTUNIV) 2 MG TB24 ER tablet Take 3 mg by mouth daily.      HYDROcodone-acetaminophen (NORCO) 5-325 MG tablet 1-2 tabs po q6 hours prn pain 30 tablet 0   Lisdexamfetamine Dimesylate (VYVANSE PO) Take 50 mg by mouth 1 day or 1 dose.      pravastatin (PRAVACHOL) 40 MG tablet Take 40 mg by mouth daily.     No current facility-administered medications for this visit.    Review of Systems  Constitutional: negative Eyes: negative Ears, nose, mouth, throat, and face: negative Respiratory: negative Cardiovascular: negative Gastrointestinal: negative Genitourinary:negative Integument/breast: negative Hematologic/lymphatic: negative Musculoskeletal:negative Neurological: negative Behavioral/Psych: negative Endocrine: negative Allergic/Immunologic: negative  Physical Exam  ZOX:WRUEA, healthy, no distress, well nourished, well developed, and anxious SKIN: skin color, texture, turgor are normal, no rashes or significant lesions HEAD: Normocephalic, No masses, lesions, tenderness or abnormalities EYES: normal, PERRLA, Conjunctiva are pink and non-injected EARS: External ears normal, Canals clear OROPHARYNX:no exudate, no erythema, and lips, buccal mucosa, and tongue normal  NECK: supple, no adenopathy, no JVD LYMPH:  no palpable lymphadenopathy, no hepatosplenomegaly LUNGS: clear to auscultation , and palpation HEART: regular rate & rhythm, no murmurs, and no gallops ABDOMEN:abdomen soft, non-tender, normal bowel sounds, and no masses or organomegaly BACK: Back symmetric, no curvature., No CVA tenderness EXTREMITIES:no joint deformities, effusion, or inflammation, no edema  NEURO: alert & oriented x 3 with fluent speech, no focal motor/sensory deficits  PERFORMANCE STATUS: ECOG 0  LABORATORY DATA: Lab Results  Component Value Date   WBC 6.8 09/08/2023   HGB 18.5 (H) 09/08/2023   HCT 53.0 (H) 09/08/2023   MCV 88.0 09/08/2023   PLT 223 09/08/2023       Chemistry   No results found for: "NA", "K", "CL", "CO2", "BUN", "CREATININE", "GLU" No results found for: "CALCIUM", "ALKPHOS", "AST", "ALT", "BILITOT"     RADIOGRAPHIC STUDIES: No results found.  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 Secondary causes like androgen use, COPD, or heavy smoking were excluded. This condition elevates blood viscosity, increasing risks of thrombosis, stroke, and heart disease, with a 20% risk of progression to acute leukemia or myelofibrosis. JAK2 mutation test ordered for confirmation, as it is positive in 95% of cases. If negative, further evaluation may include bone marrow biopsy. - Order JAK2 mutation test - Perform phlebotomy to reduce hematocrit to around 45 - Prescribe aspirin 81 mg daily - Consider hydroxyurea if JAK2 mutation test is positive - Schedule follow-up in one month to review JAK2 mutation results and repeat blood work  Hypertension Hypertension is well-controlled with current medication. No changes in management were discussed.  Hyperlipidemia Hyperlipidemia is well-managed with current medication. No changes in management were discussed.  Attention Deficit Hyperactivity Disorder (ADHD) ADHD is well-managed with current medication. No changes in management were discussed.  Follow-up Follow-up needed to review JAK2 mutation test results and assess effectiveness of phlebotomy and any additional treatments initiated. - Schedule follow-up appointment in one month - Ensure he sets up MyChart for access to test results  and follow-up information   The patient was advised to call immediately if he has any other concerning symptoms in the interval. The patient voices understanding of current disease status and treatment options and is in agreement with the current care plan.  All  questions were answered. The patient knows to call the clinic with any problems, questions or concerns. We can certainly see the patient much sooner if necessary.  Thank you so much for allowing me to participate in the care of Cathlean Sauer. I will continue to follow up the patient with you and assist in his care.  The total time spent in the appointment was 60 minutes.  Disclaimer: This note was dictated with voice recognition software. Similar sounding words can inadvertently be transcribed and may not be corrected upon review.   Lajuana Matte September 08, 2023, 12:01 PM

## 2023-09-08 NOTE — Progress Notes (Signed)
 Patient arrived for a therapeutic phlebotomy- met parameters. 16g placed in the L AC and at 1448 phlebotomy started. 508g removed and procedure completed at 1455. Patient tolerated well. Needle removed. Stayed for the 30 minute observation and was offered snack and fluids.vss-  BP 113/78 (BP Location: Right Arm, Patient Position: Sitting)   Pulse 100   Temp 98.2 F (36.8 C) (Oral)   Resp 18   SpO2 96%   Ambulatory to the lobby.

## 2023-09-09 LAB — ERYTHROPOIETIN: Erythropoietin: 9.7 m[IU]/mL (ref 2.6–18.5)

## 2023-09-14 LAB — NGS JAK2 EXONS 12-15

## 2023-10-06 ENCOUNTER — Inpatient Hospital Stay: Attending: Internal Medicine

## 2023-10-06 ENCOUNTER — Inpatient Hospital Stay (HOSPITAL_BASED_OUTPATIENT_CLINIC_OR_DEPARTMENT_OTHER): Admitting: Internal Medicine

## 2023-10-06 VITALS — BP 112/90 | HR 114 | Temp 97.7°F | Resp 18 | Wt 249.5 lb

## 2023-10-06 DIAGNOSIS — R718 Other abnormality of red blood cells: Secondary | ICD-10-CM | POA: Diagnosis not present

## 2023-10-06 DIAGNOSIS — D751 Secondary polycythemia: Secondary | ICD-10-CM | POA: Insufficient documentation

## 2023-10-06 LAB — CBC WITH DIFFERENTIAL (CANCER CENTER ONLY)
Abs Immature Granulocytes: 0.02 10*3/uL (ref 0.00–0.07)
Basophils Absolute: 0.1 10*3/uL (ref 0.0–0.1)
Basophils Relative: 1 %
Eosinophils Absolute: 0.1 10*3/uL (ref 0.0–0.5)
Eosinophils Relative: 1 %
HCT: 54.3 % — ABNORMAL HIGH (ref 39.0–52.0)
Hemoglobin: 18.9 g/dL — ABNORMAL HIGH (ref 13.0–17.0)
Immature Granulocytes: 0 %
Lymphocytes Relative: 23 %
Lymphs Abs: 1.9 10*3/uL (ref 0.7–4.0)
MCH: 30.9 pg (ref 26.0–34.0)
MCHC: 34.8 g/dL (ref 30.0–36.0)
MCV: 88.9 fL (ref 80.0–100.0)
Monocytes Absolute: 0.8 10*3/uL (ref 0.1–1.0)
Monocytes Relative: 10 %
Neutro Abs: 5.4 10*3/uL (ref 1.7–7.7)
Neutrophils Relative %: 65 %
Platelet Count: 224 10*3/uL (ref 150–400)
RBC: 6.11 MIL/uL — ABNORMAL HIGH (ref 4.22–5.81)
RDW: 13.2 % (ref 11.5–15.5)
WBC Count: 8.3 10*3/uL (ref 4.0–10.5)
nRBC: 0 % (ref 0.0–0.2)

## 2023-10-06 LAB — CMP (CANCER CENTER ONLY)
ALT: 55 U/L — ABNORMAL HIGH (ref 0–44)
AST: 37 U/L (ref 15–41)
Albumin: 4.8 g/dL (ref 3.5–5.0)
Alkaline Phosphatase: 70 U/L (ref 38–126)
Anion gap: 8 (ref 5–15)
BUN: 15 mg/dL (ref 6–20)
CO2: 24 mmol/L (ref 22–32)
Calcium: 10 mg/dL (ref 8.9–10.3)
Chloride: 107 mmol/L (ref 98–111)
Creatinine: 1.32 mg/dL — ABNORMAL HIGH (ref 0.61–1.24)
GFR, Estimated: >60 mL/min (ref >60)
Glucose, Bld: 118 mg/dL — ABNORMAL HIGH (ref 70–99)
Potassium: 4.4 mmol/L (ref 3.5–5.1)
Sodium: 139 mmol/L (ref 135–145)
Total Bilirubin: 1.1 mg/dL (ref 0.0–1.2)
Total Protein: 8.2 g/dL — ABNORMAL HIGH (ref 6.5–8.1)

## 2023-10-06 LAB — LACTATE DEHYDROGENASE: LDH: 201 U/L — ABNORMAL HIGH (ref 98–192)

## 2023-10-06 NOTE — Progress Notes (Signed)
 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  INTERVAL HISTORY: Edward Chapman 63 y.o. male returns to the clinic today for follow-up visit.Discussed the use of AI scribe software for clinical note transcription with the patient, who gave verbal consent to proceed.  History of Present Illness   Edward Chapman is a 63 year old male with polycythemia who presents for evaluation and repeat blood work.  He has polycythemia, previously evaluated with a JAK2 mutation panel that returned negative, indicating a reactive cause rather than polycythemia vera. His hemoglobin level is elevated at 18.9 g/dL today, and he is awaiting further diagnostic evaluation to rule out any underlying causes for his elevated hemoglobin levels.  He has been considering regular blood donation as part of his management plan, prompted by frequent calls from the [facility] requesting donations.  No new symptoms or complaints, including no aches or pains that might suggest an enlarged spleen or kidney.       MEDICAL HISTORY: Past Medical History:  Diagnosis Date   ADD (attention deficit disorder)    Flexor tendon rupture of hand    right hand    ALLERGIES:  has no known allergies.  MEDICATIONS:  Current Outpatient Medications  Medication Sig Dispense Refill   celecoxib (CELEBREX) 200 MG capsule Take 200 mg by mouth daily. 200mg  x2 daily once per pt     guanFACINE (INTUNIV) 2 MG TB24 ER tablet Take 3 mg by mouth daily.      HYDROcodone-acetaminophen (NORCO) 5-325 MG tablet 1-2 tabs po q6 hours prn pain 30 tablet 0   Lisdexamfetamine Dimesylate (VYVANSE PO) Take 50 mg by mouth 1 day or 1 dose.      pravastatin (PRAVACHOL) 40 MG  tablet Take 40 mg by mouth daily.     No current facility-administered medications for this visit.    SURGICAL HISTORY:  Past Surgical History:  Procedure Laterality Date   FLEXOR TENDON REPAIR Right 06/09/2017   Procedure: RIGHT FLEXOR TENDON REPAIR RECONSTRUCTION;  Surgeon: Betha Loa, MD;  Location: [facility];  Service: Orthopedics;  Laterality: Right;   FLEXOR TENDON REPAIR Right 12/15/2017   Procedure: RIGHT SMALL FINGER FLEXOR TENDON RECONSTRUCTION HUNTER OF PLACEMENT;  Surgeon: Betha Loa, MD;  Location: [facility];  Service: Orthopedics;  Laterality: Right;   FLEXOR TENDON REPAIR Left 03/30/2018   Procedure: RIGHT SMALL FINGER STAGE 2 FLEXOR TENDON RECONSTRUCTION WITH TENDON GRAFT;  Surgeon: Betha Loa, MD;  Location: [facility];  Service: Orthopedics;  Laterality: Left;   NERVE, TENDON AND ARTERY REPAIR Right 05/20/2017   Procedure: NERVE, TENDON AND ARTERY REPAIR RIGHT SMALL FINGER;  Surgeon: Betha Loa, MD;  Location: [facility];  Service: Orthopedics;  Laterality: Right;   TENDON TRANSFER Left 03/30/2018   Procedure: TENDON TRANSFER;  Surgeon: Betha Loa, MD;  Location: [facility];  Service: Orthopedics;  Laterality: Left;  Palmaris longis left graft   TENOLYSIS Right 11/08/2017   Procedure: RIGHT SMALL FLEXOR TENOLYSIS;  Surgeon: Betha Loa, MD;  [facility];  Service: Orthopedics;  Laterality: Right;   WRIST SURGERY      REVIEW OF SYSTEMS:  A comprehensive review of systems was negative.  PHYSICAL EXAMINATION: General appearance: alert, cooperative, fatigued, and no distress Head: Normocephalic, without obvious abnormality, atraumatic Neck: no adenopathy, no JVD, supple, symmetrical, trachea midline, and thyroid not enlarged, symmetric, no tenderness/mass/nodules Lymph nodes: Cervical, supraclavicular, and axillary nodes normal. Resp: clear to auscultation  bilaterally Back: symmetric, no curvature. ROM normal. No CVA tenderness. Cardio: regular rate and rhythm, S1, S2 normal, no murmur, click, rub or gallop GI: soft, non-tender; bowel sounds normal; no masses,  no organomegaly Extremities: extremities normal, atraumatic, no cyanosis or edema  ECOG PERFORMANCE STATUS: 0 - Asymptomatic  Blood pressure (!) 112/90, pulse (!) 114, temperature 97.7 F (36.5 C), temperature source Temporal, resp. rate 18, weight 249 lb 8 oz (113.2 kg), SpO2 96%.  LABORATORY DATA: Lab Results  Component Value Date   WBC 8.3 10/06/2023   HGB 18.9 (H) 10/06/2023   HCT 54.3 (H) 10/06/2023   MCV 88.9 10/06/2023   PLT 224 10/06/2023      Chemistry      Component Value Date/Time   NA 139 10/06/2023 1337   K 4.4 10/06/2023 1337   CL 107 10/06/2023 1337   CO2 24 10/06/2023 1337   BUN 15 10/06/2023 1337   CREATININE 1.32 (H) 10/06/2023 1337      Component Value Date/Time   CALCIUM 10.0 10/06/2023 1337   ALKPHOS 70 10/06/2023 1337   AST 37 10/06/2023 1337   ALT 55 (H) 10/06/2023 1337   BILITOT 1.1 10/06/2023 1337       RADIOGRAPHIC STUDIES: No results found.  ASSESSMENT AND PLAN:     Reactive Polycythemia Edward Chapman presents with elevated hemoglobin levels at 18.9 g/dL, indicating polycythemia. The JAK2 mutation panel was negative, suggesting reactive polycythemia, possibly secondary to other factors. Secondary causes and anatomical issues such as splenomegaly or renal enlargement need to be ruled out. - Order abdominal ultrasound to evaluate for splenomegaly or renal enlargement. - Advise blood donation to manage elevated hemoglobin levels. - Schedule follow-up appointment in two months to reassess condition.   The patient was advised to call immediately if he has any concerning symptoms in the interval. The patient voices understanding of current disease status and treatment options and is in agreement with the current care plan.  All  questions were answered. The patient knows to call the clinic with any problems, questions or concerns. We can certainly see the patient much sooner if necessary.  The total time spent in the appointment was 20 minutes.  Disclaimer: This note was dictated with voice recognition software. Similar sounding words can inadvertently be transcribed and may not be corrected upon review.

## 2023-10-07 ENCOUNTER — Telehealth: Payer: Self-pay | Admitting: Internal Medicine

## 2023-10-07 NOTE — Telephone Encounter (Signed)
 Left a voicemail with appointment details and will be mailed a reminder.

## 2023-10-18 ENCOUNTER — Telehealth: Payer: Self-pay | Admitting: Physician Assistant

## 2023-10-18 NOTE — Telephone Encounter (Signed)
 The patient left be a voicemail about scheduling a scan? No completely sure. Called and left the patient a voicemail with already scheduled appointment details.

## 2023-11-01 ENCOUNTER — Encounter: Payer: Self-pay | Admitting: Internal Medicine

## 2023-11-03 ENCOUNTER — Ambulatory Visit (HOSPITAL_COMMUNITY)
Admission: RE | Admit: 2023-11-03 | Discharge: 2023-11-03 | Disposition: A | Discharge status: Home / Self Care | Source: Ambulatory Visit | Attending: Internal Medicine | Admitting: Internal Medicine

## 2023-11-03 DIAGNOSIS — R718 Other abnormality of red blood cells: Secondary | ICD-10-CM | POA: Insufficient documentation

## 2023-11-04 ENCOUNTER — Encounter: Payer: Self-pay | Admitting: Internal Medicine

## 2023-12-13 ENCOUNTER — Inpatient Hospital Stay: Admitting: Internal Medicine

## 2023-12-13 ENCOUNTER — Inpatient Hospital Stay: Attending: Internal Medicine

## 2024-05-10 ENCOUNTER — Other Ambulatory Visit (HOSPITAL_COMMUNITY): Payer: Self-pay

## 2024-05-10 ENCOUNTER — Encounter: Payer: Self-pay | Admitting: Cardiology

## 2024-05-10 ENCOUNTER — Encounter: Payer: Self-pay | Admitting: Internal Medicine

## 2024-05-10 ENCOUNTER — Ambulatory Visit: Attending: Cardiovascular Disease | Admitting: Cardiology

## 2024-05-10 VITALS — BP 118/73 | Ht 74.0 in | Wt 259.0 lb

## 2024-05-10 DIAGNOSIS — I7121 Aneurysm of the ascending aorta, without rupture: Secondary | ICD-10-CM | POA: Diagnosis not present

## 2024-05-10 DIAGNOSIS — E782 Mixed hyperlipidemia: Secondary | ICD-10-CM | POA: Diagnosis not present

## 2024-05-10 DIAGNOSIS — I77810 Thoracic aortic ectasia: Secondary | ICD-10-CM | POA: Insufficient documentation

## 2024-05-10 DIAGNOSIS — I2510 Atherosclerotic heart disease of native coronary artery without angina pectoris: Secondary | ICD-10-CM | POA: Diagnosis not present

## 2024-05-10 MED ORDER — PRAVASTATIN SODIUM 80 MG PO TABS
80.0000 mg | ORAL_TABLET | Freq: Every evening | ORAL | 3 refills | Status: AC
  Filled 2024-05-10: qty 30, 30d supply, fill #0

## 2024-05-10 NOTE — Progress Notes (Signed)
 Cardiology Office Note:  .   Date:  05/10/2024  ID:  Edward Chapman, DOB 12/08/1960, MRN [id number] PCP: Royden Ronal Czar, FNP  [facility] Providers Cardiologist:  Newman Lawrence, MD PCP: Royden Ronal Czar, FNP  Chief Complaint  Patient presents with   Coronary Artery Disease   Thoracic aortic actasia     Edward Chapman is a 63 y.o. male with hypertension, hyperlipidemia, polycythemia with JAK2 mutation, coronary artery calcification, ectatic ascending aorta  Discussed the use of AI scribe software for clinical note transcription with the patient, who gave verbal consent to proceed.  History of Present Illness Edward Chapman is a 63 year old male with hypertension and polycythemia who presents for evaluation of coronary artery disease.  Hypertension is well-controlled with medication. LDL cholesterol has decreased from 116 mg/dL in April to 82 mg/dL in August with pravastatin, without experiencing joint pain. A CT scan in March showed significant coronary artery calcium and a slightly dilated ascending aorta.  Polycythemia is present, managed with baby aspirin, without specific medication. He leads a sedentary lifestyle, acknowledges the need for increased physical activity, and follows a diet of two meals a day, focusing on chicken and fish. He snacks on pretzels and sweets at night.  There is no chest pain, shortness of breath, chest tightness, jaw pain, chest pressure, leg swelling, lightheadedness, syncope, or palpitations.      Vitals:   05/10/24 0825  BP: 118/73  SpO2: 96%      Review of Systems  Cardiovascular:  Negative for chest pain, dyspnea on exertion, leg swelling, palpitations and syncope.        Studies Reviewed: SABRA        EKG 05/10/2024: Normal sinus rhythm Left axis deviation Right bundle branch block No previous ECGs available    CT cardiac scoring 09/2023: 1. The Agatston CAC score is  584. This places the patient between  the 75th and 90th percentile for age and gender based on the [facility] data base.  2. CAC is present in the left main, LAD, LCx, RCA.  3. 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   Labs 02/2024: Chol 154, TG 145, HDL 47, LDL 82  Labs 10/2023: Chol 803, TG 220, HDL 41, LDL 116 Hb 18.9 Cr 1.32, ALT 55, total protein 8.2 TSH 0.9   Physical Exam Vitals and nursing note reviewed.  Constitutional:      General: He is not in acute distress. Neck:     Vascular: No JVD.  Cardiovascular:     Rate and Rhythm: Normal rate and regular rhythm.     Heart sounds: Normal heart sounds. No murmur heard. Pulmonary:     Effort: Pulmonary effort is normal.     Breath sounds: Normal breath sounds. No wheezing or rales.  Musculoskeletal:     Right lower leg: No edema.     Left lower leg: No edema.      VISIT DIAGNOSES:   ICD-10-CM   1. Coronary artery disease involving native coronary artery of native heart without angina pectoris  I25.10 EKG 12-Lead    NM PET CT CARDIAC PERFUSION MULTI W/ABSOLUTE BLOODFLOW    Cardiac Stress Test: Informed Consent Details: Physician/Practitioner Attestation; Transcribe to consent form and obtain patient signature    2. Aneurysm of ascending aorta without rupture  I71.21 EKG 12-Lead    Basic metabolic panel with GFR    Basic metabolic panel with GFR    CT ANGIO CHEST AORTA W/ &  OR WO/CM & GATING (HEART & VASCULAR TOWER ONLY)    CANCELED: CT ANGIO CHEST AORTA W/ & OR WO/CM & GATING (HEART & VASCULAR TOWER ONLY)    3. Mixed hyperlipidemia  E78.2 Lipid panel    Lipoprotein A (LPA)       Edward Chapman is a 63 y.o. male with hypertension, hyperlipidemia, polycythemia with JAK2 mutation, coronary artery calcification, ectatic ascending aorta Assessment & Plan Coronary artery disease with coronary artery calcification: CT scan showed significant coronary artery calcification with a calcium score between the 75th to 90th percentile. No current  cardiac symptoms. Aim to prevent progression and reduce myocardial infarction risk through risk factor modification. - Ordered PET CT stress test to evaluate for myocardial ischemia. - Continue aspirin therapy. - Increase physical activity to 10,000 steps daily. - Adopt a heart-healthy diet.  Ascending aortic ectasia: CT scan indicated slight dilation of the ascending aorta, indexed to body surface area. Monitoring required, no immediate intervention. - Order CTA of the aorta in March next year to monitor size. - Avoid heavy lifting; aerobic activity is permissible.  Hyperlipidemia in the setting of coronary artery disease: LDL cholesterol decreased from 116 mg/dL to 82 mg/dL with pravastatin. Target LDL is below 70 mg/dL due to significant coronary artery calcification. Recommended high-intensity statin therapy. Discussed potential side effects and agreed to increase pravastatin dosage. - Increased pravastatin to 80 mg daily. - Ordered lipid panel and lipoprotein A in one month. - Discussed potential participation in clinical trials for cholesterol management.  Hypertension: Blood pressure well-controlled with current medication regimen.  Attention deficit hyperactivity disorder (ADHD): ADHD managed with Adderall, effective in maintaining balance and preventing symptoms. - Continue current ADHD medication regimen.     Informed Consent   Shared Decision Making/Informed Consent The risks [chest pain, shortness of breath, cardiac arrhythmias, dizziness, blood pressure fluctuations, myocardial infarction, stroke/transient ischemic attack, nausea, vomiting, allergic reaction, radiation exposure, metallic taste sensation and life-threatening complications (estimated to be 1 in 10,000)], benefits (risk stratification, diagnosing coronary artery disease, treatment guidance) and alternatives of a cardiac PET stress test were discussed in detail with Mr. Wheeling and he agrees to proceed.        Meds ordered this encounter  Medications   pravastatin (PRAVACHOL) 80 MG tablet    Sig: Take 1 tablet (80 mg total) by mouth every evening.    Dispense:  30 tablet    Refill:  3     F/u in 10/2024  Signed, Newman JINNY Lawrence, MD

## 2024-05-10 NOTE — Patient Instructions (Addendum)
 Medication Instructions:   No changes  *If you need a refill on your cardiac medications before your next appointment, please call your pharmacy*   Lab Work:  in one month fasting  LIPID LP(a)     BMP -- march 2026   If you have labs (blood work) drawn today and your tests are completely normal, you will receive your results only by: MyChart Message (if you have MyChart) OR A paper copy in the mail If you have any lab test that is abnormal or we need to change your treatment, we will call you to review the results.   Testing/Procedures: Recommend you having a PET SCAN at [facility]  1) A cardiac PET scan is an accurate, noninvasive test that creates images of your heart. A healthcare provider can use these images to make decisions about the next steps in your heart care. They can judge how healthy your heart muscle is and decide how to treat it. A cardiac PET scan uses a small amount of radiation   2)  Schedule in March 2026  You will need to have labs - BMP Cardiac CT Angiography (CTA), is a special type of CT scan that uses a computer to produce multi-dimensional views of major blood vessels ( aorta). In CT angiography, a contrast material is injected through an IV to help visualize the blood vessels   Follow-Up: At [facility], you and your health needs are our priority.  As part of our continuing mission to provide you with exceptional heart care, we have created designated Provider Care Teams.  These Care Teams include your primary Cardiologist (physician) and Advanced Practice Providers (APPs -  Physician Assistants and Nurse Practitioners) who all work together to provide you with the care you need, when you need it.     Your next appointment:   5 month(s)  The format for your next appointment:   In Person  Provider:   Newman JINNY Lawrence, MD   Other Instructions      Please report to Radiology at the [facility] Main Entrance 30 minutes  early for your test.  [address] [place], [place] [address]                         OR   Please report to Radiology at [facility] Main Entrance, medical mall, 30 mins prior to your test.  [address]  [place], [place]  How to Prepare for Your Cardiac PET/CT Stress Test:  Nothing to eat or drink, except water, 3 hours prior to arrival time.  NO caffeine/decaffeinated products, or chocolate 12 hours prior to arrival. (Please note decaffeinated beverages (teas/coffees) still contain caffeine).  If you have caffeine within 12 hours prior, the test will need to be rescheduled.  Medication instructions: Do not take erectile dysfunction medications for 72 hours prior to test (sildenafil, tadalafil) Do not take nitrates (isosorbide mononitrate, Ranexa) the day before or day of test Do not take tamsulosin the day before or morning of test Hold theophylline containing medications for 12 hours. Hold Dipyridamole 48 hours prior to the test.  Diabetic Preparation: If able to eat breakfast prior to 3 hour fasting, you may take all medications, including your insulin. Do not worry if you miss your breakfast dose of insulin - start at your next meal. If you do not eat prior to 3 hour fast-Hold all diabetes (oral and insulin) medications. Patients  who wear a continuous glucose monitor MUST remove the device prior to scanning.  You may take your remaining medications with water.  NO perfume, cologne or lotion on chest or abdomen area. FEMALES - Please avoid wearing dresses to this appointment.  Total time is 1 to 2 hours; you may want to bring reading material for the waiting time.  IF YOU THINK YOU MAY BE PREGNANT, OR ARE NURSING PLEASE INFORM THE TECHNOLOGIST.  In preparation for your appointment, medication and supplies will be purchased.  Appointment availability is limited, so if you need to cancel or reschedule, please call the Radiology Department  Scheduler at [phone] 24 hours in advance to avoid a cancellation fee of $100.00  What to Expect When you Arrive:  Once you arrive and check in for your appointment, you will be taken to a preparation room within the Radiology Department.  A technologist or Nurse will obtain your medical history, verify that you are correctly prepped for the exam, and explain the procedure.  Afterwards, an IV will be started in your arm and electrodes will be placed on your skin for EKG monitoring during the stress portion of the exam. Then you will be escorted to the PET/CT scanner.  There, staff will get you positioned on the scanner and obtain a blood pressure and EKG.  During the exam, you will continue to be connected to the EKG and blood pressure machines.  A small, safe amount of a radioactive tracer will be injected in your IV to obtain a series of pictures of your heart along with an injection of a stress agent.    After your Exam:  It is recommended that you eat a meal and drink a caffeinated beverage to counter act any effects of the stress agent.  Drink plenty of fluids for the remainder of the day and urinate frequently for the first couple of hours after the exam.  Your doctor will inform you of your test results within 7-10 business days.  For more information and frequently asked questions, please visit our website: [email or web address]  For questions about your test or how to prepare for your test, please call: Cardiac Imaging Nurse Navigators Office: [phone]

## 2024-05-22 ENCOUNTER — Other Ambulatory Visit (HOSPITAL_COMMUNITY): Payer: Self-pay

## 2024-06-08 ENCOUNTER — Ambulatory Visit: Payer: Self-pay | Admitting: Cardiology

## 2024-06-08 NOTE — Progress Notes (Signed)
 Cholesterol has come down nicely.  Continue current medications.  Thanks MJP

## 2024-06-08 NOTE — Telephone Encounter (Signed)
 Letter of results sent to pt

## 2024-06-09 LAB — LIPID PANEL
Chol/HDL Ratio: 2.9 ratio (ref 0.0–5.0)
Cholesterol, Total: 132 mg/dL (ref 100–199)
HDL: 45 mg/dL (ref >39)
LDL Chol Calc (NIH): 64 mg/dL (ref 0–99)
Triglycerides: 133 mg/dL (ref 0–149)
VLDL Cholesterol Cal: 23 mg/dL (ref 5–40)

## 2024-06-09 LAB — LIPOPROTEIN A (LPA): Lipoprotein (a): <8.4 nmol/L (ref <75.0)
